# Patient Record
Sex: Female | Born: 1975 | Race: White | Hispanic: No | Marital: Single | Smoking: Never smoker
Health system: Southern US, Community
[De-identification: ages and names within clinical notes are randomized; demographics above are authoritative.]

## PROBLEM LIST (undated history)

## (undated) DIAGNOSIS — E785 Hyperlipidemia, unspecified: Secondary | ICD-10-CM

## (undated) DIAGNOSIS — F32A Depression, unspecified: Secondary | ICD-10-CM

## (undated) DIAGNOSIS — E119 Type 2 diabetes mellitus without complications: Secondary | ICD-10-CM

## (undated) DIAGNOSIS — J45909 Unspecified asthma, uncomplicated: Secondary | ICD-10-CM

## (undated) DIAGNOSIS — F329 Major depressive disorder, single episode, unspecified: Secondary | ICD-10-CM

## (undated) DIAGNOSIS — I10 Essential (primary) hypertension: Secondary | ICD-10-CM

## (undated) DIAGNOSIS — J302 Other seasonal allergic rhinitis: Secondary | ICD-10-CM

## (undated) HISTORY — DX: Hyperlipidemia, unspecified: E78.5

## (undated) HISTORY — DX: Essential (primary) hypertension: I10

---
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
## 1998-06-08 ENCOUNTER — Other Ambulatory Visit: Admission: RE | Admit: 1998-06-08 | Discharge: 1998-06-08 | Payer: Self-pay | Admitting: Gynecology

## 1999-06-29 ENCOUNTER — Other Ambulatory Visit: Admission: RE | Admit: 1999-06-29 | Discharge: 1999-06-29 | Payer: Self-pay | Admitting: Gynecology

## 2000-07-11 ENCOUNTER — Other Ambulatory Visit: Admission: RE | Admit: 2000-07-11 | Discharge: 2000-07-11 | Payer: Self-pay | Admitting: Gynecology

## 2001-07-30 ENCOUNTER — Other Ambulatory Visit: Admission: RE | Admit: 2001-07-30 | Discharge: 2001-07-30 | Payer: Self-pay | Admitting: Gynecology

## 2001-07-30 ENCOUNTER — Other Ambulatory Visit: Admission: RE | Admit: 2001-07-30 | Discharge: 2001-07-30 | Payer: Self-pay | Admitting: Obstetrics and Gynecology

## 2002-08-16 ENCOUNTER — Other Ambulatory Visit: Admission: RE | Admit: 2002-08-16 | Discharge: 2002-08-16 | Payer: Self-pay | Admitting: Obstetrics and Gynecology

## 2002-08-18 ENCOUNTER — Other Ambulatory Visit: Admission: RE | Admit: 2002-08-18 | Discharge: 2002-08-18 | Payer: Self-pay | Admitting: Obstetrics and Gynecology

## 2003-08-22 ENCOUNTER — Other Ambulatory Visit: Admission: RE | Admit: 2003-08-22 | Discharge: 2003-08-22 | Payer: Self-pay | Admitting: Obstetrics and Gynecology

## 2004-09-03 ENCOUNTER — Other Ambulatory Visit: Admission: RE | Admit: 2004-09-03 | Discharge: 2004-09-03 | Payer: Self-pay | Admitting: Obstetrics and Gynecology

## 2005-09-10 ENCOUNTER — Other Ambulatory Visit: Admission: RE | Admit: 2005-09-10 | Discharge: 2005-09-10 | Payer: Self-pay | Admitting: Obstetrics and Gynecology

## 2011-01-23 ENCOUNTER — Other Ambulatory Visit: Payer: Self-pay | Admitting: Family Medicine

## 2011-01-23 ENCOUNTER — Other Ambulatory Visit: Payer: Self-pay

## 2011-01-23 ENCOUNTER — Ambulatory Visit
Admission: RE | Admit: 2011-01-23 | Discharge: 2011-01-23 | Disposition: A | Discharge status: Home / Self Care | Payer: BC Managed Care – PPO | Source: Ambulatory Visit | Attending: Family Medicine | Admitting: Family Medicine

## 2011-01-23 DIAGNOSIS — R609 Edema, unspecified: Secondary | ICD-10-CM

## 2014-05-13 ENCOUNTER — Encounter (HOSPITAL_BASED_OUTPATIENT_CLINIC_OR_DEPARTMENT_OTHER): Payer: Self-pay | Admitting: *Deleted

## 2014-05-13 ENCOUNTER — Emergency Department (HOSPITAL_BASED_OUTPATIENT_CLINIC_OR_DEPARTMENT_OTHER)
Admission: EM | Admit: 2014-05-13 | Discharge: 2014-05-13 | Disposition: A | Discharge status: Home / Self Care | Payer: BC Managed Care – PPO | Attending: Emergency Medicine | Admitting: Emergency Medicine

## 2014-05-13 DIAGNOSIS — Y9301 Activity, walking, marching and hiking: Secondary | ICD-10-CM | POA: Diagnosis not present

## 2014-05-13 DIAGNOSIS — Y92219 Unspecified school as the place of occurrence of the external cause: Secondary | ICD-10-CM | POA: Insufficient documentation

## 2014-05-13 DIAGNOSIS — Z793 Long term (current) use of hormonal contraceptives: Secondary | ICD-10-CM | POA: Diagnosis not present

## 2014-05-13 DIAGNOSIS — Y998 Other external cause status: Secondary | ICD-10-CM | POA: Diagnosis not present

## 2014-05-13 DIAGNOSIS — F329 Major depressive disorder, single episode, unspecified: Secondary | ICD-10-CM | POA: Insufficient documentation

## 2014-05-13 DIAGNOSIS — S86111A Strain of other muscle(s) and tendon(s) of posterior muscle group at lower leg level, right leg, initial encounter: Secondary | ICD-10-CM

## 2014-05-13 DIAGNOSIS — S8992XA Unspecified injury of left lower leg, initial encounter: Secondary | ICD-10-CM | POA: Diagnosis present

## 2014-05-13 DIAGNOSIS — Z79899 Other long term (current) drug therapy: Secondary | ICD-10-CM | POA: Insufficient documentation

## 2014-05-13 DIAGNOSIS — S86812A Strain of other muscle(s) and tendon(s) at lower leg level, left leg, initial encounter: Secondary | ICD-10-CM | POA: Insufficient documentation

## 2014-05-13 DIAGNOSIS — X58XXXA Exposure to other specified factors, initial encounter: Secondary | ICD-10-CM | POA: Diagnosis not present

## 2014-05-13 HISTORY — DX: Major depressive disorder, single episode, unspecified: F32.9

## 2014-05-13 HISTORY — DX: Depression, unspecified: F32.A

## 2014-05-13 HISTORY — DX: Other seasonal allergic rhinitis: J30.2

## 2014-05-13 NOTE — ED Provider Notes (Signed)
CSN: [id number]     Arrival date & time 05/13/14  1945 History  This chart was scribed for Malvin Johns, MD by Tula Nakayama, ED Scribe. This patient was seen in room MHT13/MHT13 and the patient's care was started at 9:53 PM.    Chief Complaint  Patient presents with  . Leg Pain   The history is provided by the patient. No language interpreter was used.    HPI Comments: Abigail Vega is a 38 y.o. 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 She notes ankle swelling as an associated symptom. Pt states she heard a "pop" while she was walking, but denies falls or other injuries. Pt went to [facility] yesterday who referred her to an orthopedist. She saw the orthopedist, Dr. Berenice Primas, who diagnosed her with a medial gastric strain. She is wearing a walking boot on her affected leg today. Pt was told that she had to see [employer] for worker's comp purposes. She is here today for compliance with HR's policies. Pt denies numbness as associated symptoms.   Past Medical History  Diagnosis Date  . Depression   . Seasonal allergies    History reviewed. No pertinent past surgical history. No family history on file. History  Substance Use Topics  . Smoking status: Never Smoker   . Smokeless tobacco: Not on file  . Alcohol Use: No   OB History    No data available     Review of Systems  Musculoskeletal: Positive for arthralgias and gait problem.  Neurological: Negative for numbness.  All other systems reviewed and are negative.  Allergies  Review of patient's allergies indicates no known allergies.  Home Medications   Prior to Admission medications   Medication Sig Start Date End Date Taking? Authorizing Provider  Montelukast Sodium (SINGULAIR PO) Take by mouth.   Yes Historical Provider, MD  norgestrel-ethinyl estradiol (LO/OVRAL,CRYSELLE) 0.3-30 MG-MCG  tablet Take 1 tablet by mouth daily.   Yes Historical Provider, MD  Sertraline HCl (ZOLOFT PO) Take by mouth.   Yes Historical Provider, MD   BP 127/68 mmHg  Pulse 84  Temp(Src) 97.9 F (36.6 C) (Oral)  Resp 18  Ht _0  (1.727 m)  Wt 240 lb (108.863 kg)  BMI 36.50 kg/m2  SpO2 97% Physical Exam  Constitutional: She is oriented to person, place, and time. She appears well-developed and well-nourished.  HENT:  Head: Normocephalic and atraumatic.  Neck: Normal range of motion. Neck supple.  Cardiovascular: Normal rate.   Pulmonary/Chest: Effort normal.  Musculoskeletal: She exhibits edema and tenderness.  +tenderness along the medial gastrocnemius muscle.  No bony tenderness.  Mild swelling around ankle.  Normal sensation and motor function.  Pedal pulses intact.  No bony tenderness to knee or ankle.  Neurological: She is alert and oriented to person, place, and time.  Skin: Skin is warm and dry.  Psychiatric: She has a normal mood and affect.  Nursing note and vitals reviewed.   ED Course  Procedures (including critical care time) DIAGNOSTIC STUDIES: Oxygen Saturation is 97% on RA, normal by my interpretation.    COORDINATION OF CARE: 10:00 PM Discussed treatment plan with pt at bedside and pt agreed to plan. Advised pt to take Ibuprofen for pain symptoms.    Labs Review Labs Reviewed - No data to display  Imaging Review No results found.   EKG Interpretation None  MDM   Final diagnoses:  Rupture of medial head of right gastrocnemius, initial encounter    Pt has walking boot given by Dr. Berenice Primas.  Will continue ibuprofen.  Advised RICE.  I personally performed the services described in this documentation, which was scribed in my presence.  The recorded information has been reviewed and considered.    Malvin Johns, MD 05/13/14 2216

## 2014-05-13 NOTE — Discharge Instructions (Signed)
Muscle Tear A muscle tear is usually caused by over-exertion or stretching. The muscle often takes a while to heal. Muscle tears require 3 to 4 weeks to heal completely. A history of the injury and a physical exam may be performed. Sometimes, the injury is identified with radiographs and an MRI. Treatment for muscle injuries includes:  Resting and protecting the affected area until pain with motion is gone.  Putting ice on the injured area.  Put ice in a bag.  Place a towel between your skin and the bag.  Leave the ice on for 15 to 20 minutes, 3 to 4 times a day.  After two days, you can use heat to relieve spasms.  Using compression wraps to help control swelling and limit movement.  Raising (elevate) the injured area above the level of the heart (if possible) for the first 1 to 2 days after the injury.  Medicine may be prescribed to reduce pain and inflammation. Avoid strenuous activities that bring on muscle pain. Exercises to strengthen and stretch the injured muscle can help heal the muscle and prevent repeated injury. After the pain and swelling are gone, you may begin gradual strengthening exercises. Begin range-of-motion exercises and gentle stretching after 3 to 4 days of rest.  SEEK MEDICAL CARE IF:  Your injured muscle is not improving after 1 week of treatment. Document Released: 06/20/2004 Document Revised: 08/05/2011 Document Reviewed: 11/25/2008 [facility] Patient Information 2015 [place], [place]. This information is not intended to replace advice given to you by your health care provider. Make sure you discuss any questions you have with your health care provider.

## 2014-05-13 NOTE — ED Notes (Signed)
Pain in her left lower leg. States she felt a pop in her calf while walking 2 days ago.

## 2015-01-22 ENCOUNTER — Other Ambulatory Visit (HOSPITAL_COMMUNITY)
Admission: RE | Admit: 2015-01-22 | Discharge: 2015-01-22 | Disposition: A | Discharge status: Home / Self Care | Payer: BC Managed Care – PPO | Source: Ambulatory Visit | Attending: Physician Assistant | Admitting: Physician Assistant

## 2015-01-22 ENCOUNTER — Other Ambulatory Visit: Payer: Self-pay | Admitting: Physician Assistant

## 2015-01-22 DIAGNOSIS — Z113 Encounter for screening for infections with a predominantly sexual mode of transmission: Secondary | ICD-10-CM | POA: Insufficient documentation

## 2015-01-22 DIAGNOSIS — N760 Acute vaginitis: Secondary | ICD-10-CM | POA: Insufficient documentation

## 2015-01-22 DIAGNOSIS — Z124 Encounter for screening for malignant neoplasm of cervix: Secondary | ICD-10-CM | POA: Diagnosis present

## 2015-01-22 DIAGNOSIS — Z1151 Encounter for screening for human papillomavirus (HPV): Secondary | ICD-10-CM | POA: Diagnosis present

## 2015-01-25 LAB — CYTOLOGY - PAP

## 2015-03-06 ENCOUNTER — Other Ambulatory Visit: Payer: Self-pay

## 2015-03-06 DIAGNOSIS — Z1231 Encounter for screening mammogram for malignant neoplasm of breast: Secondary | ICD-10-CM

## 2015-03-23 ENCOUNTER — Ambulatory Visit
Admission: RE | Admit: 2015-03-23 | Discharge: 2015-03-23 | Disposition: A | Discharge status: Home / Self Care | Payer: BC Managed Care – PPO | Source: Ambulatory Visit

## 2015-03-23 DIAGNOSIS — Z1231 Encounter for screening mammogram for malignant neoplasm of breast: Secondary | ICD-10-CM

## 2017-07-18 ENCOUNTER — Other Ambulatory Visit: Payer: Self-pay | Admitting: Family Medicine

## 2017-07-18 ENCOUNTER — Other Ambulatory Visit (HOSPITAL_COMMUNITY)
Admission: RE | Admit: 2017-07-18 | Discharge: 2017-07-18 | Disposition: A | Discharge status: Home / Self Care | Payer: BC Managed Care – PPO | Source: Ambulatory Visit | Attending: Family Medicine | Admitting: Family Medicine

## 2017-07-18 DIAGNOSIS — Z124 Encounter for screening for malignant neoplasm of cervix: Secondary | ICD-10-CM | POA: Diagnosis present

## 2017-07-22 LAB — CYTOLOGY - PAP
DIAGNOSIS: NEGATIVE
HPV (WINDOPATH): NOT DETECTED

## 2017-09-16 ENCOUNTER — Other Ambulatory Visit: Payer: Self-pay | Admitting: Family Medicine

## 2017-09-16 DIAGNOSIS — Z1231 Encounter for screening mammogram for malignant neoplasm of breast: Secondary | ICD-10-CM

## 2017-09-18 ENCOUNTER — Ambulatory Visit
Admission: RE | Admit: 2017-09-18 | Discharge: 2017-09-18 | Disposition: A | Discharge status: Home / Self Care | Payer: BC Managed Care – PPO | Source: Ambulatory Visit | Attending: Family Medicine | Admitting: Family Medicine

## 2017-09-18 ENCOUNTER — Other Ambulatory Visit: Payer: Self-pay | Admitting: Family Medicine

## 2017-09-18 DIAGNOSIS — Z1231 Encounter for screening mammogram for malignant neoplasm of breast: Secondary | ICD-10-CM

## 2017-10-27 NOTE — Telephone Encounter (Signed)
 OK   Electronically signed by: Richardson Rumalda Metro, MD 10/27/17 [phone]

## 2017-10-30 NOTE — Progress Notes (Signed)
 Chief Complaint  Patient presents with  . optic disc edema    new pt here for unlaterial optic disc edema in lt eye she can't see out of eye.  pt is rt handed and she drinks about 16 oz of caffeine a week.      The patient is a 42 y.o. right handed White or Caucasian   Female  With papilledema, decreased vision left eye, sudden onset 10/24/17, left eye pain X 2 weeks, left disc edema by eye exam. Rare migraines, frequent milder HAs X long time. Has had sinusitis/ear pain in April. Some left neck pain.   The patient denies dizziness, weakness,  numbness or tingling, nausea, vomiting, seizures or loss of consciousness, trauma. Some caffeine.  Current Outpatient Prescriptions:  .  albuterol  90 mcg/actuation inhaler, Inhale into the lungs., Disp: , Rfl:  .  buPROPion  XL (WELLBUTRIN  XL) 150 MG 24 hr tablet, Take 150 mg by mouth daily., Disp: , Rfl:  .  cetirizine (ZYRTEC) 10 MG tablet, Take 10 mg by mouth., Disp: , Rfl:  .  metFORMIN (GLUCOPHAGE) 500 MG tablet, Take 500 mg by mouth 2 times daily., Disp: , Rfl:  .  montelukast  (SINGULAIR ) 10 mg tablet, , Disp: , Rfl:  .  norethindrone-ethinyl estradiol-iron (MICROGESTIN FE1.5/30) 1.5 mg-30 mcg (21)/75 mg (7) tablet, Take by mouth., Disp: , Rfl:  .  sertraline  (ZOLOFT ) 100 MG tablet, Take 50 mg by mouth. , Disp: , Rfl:  .  SUMAtriptan  (IMITREX ) 100 MG tablet, Take 100 mg by mouth. Maximum 200 mg/day., Disp: , Rfl:    Past Medical History:  Diagnosis Date  . Allergy   . Asthma   . Diabetes mellitus (HCC)   . Migraine      Past Surgical History:  Procedure Laterality Date  . EUA TONSILS    . NOSE SURGERY    . OTOPLASTY      Patient Active Problem List  Diagnosis  . Prediabetes  . Urge incontinence of urine  . Essential hematuria  . Conductive hearing loss, bilateral  . Dysfunction of both eustachian tubes  . Chronic pansinusitis  . Asthma  . Hematuria  . Papilledema  . Visual loss, left eye    Allergies:  Patient has no  known allergies.  Family History  Problem Relation Age of Onset  . Allergic rhinitis Father   . Diabetes Paternal Grandfather          A complete ROS was performed with pertinent positives and negatives noted in the HPI, and all others were negative except for above.   BP 130/74 (Site: Right arm, Position: Sitting, BP Cuff Size: Large)   Pulse 72   Ht 1.727 m (5' 8)   Wt (!) 155.1 kg (342 lb)   BMI 52.00 kg/m     The patient is normocephalic and atraumatic, well nourished.  There were no carotid bruits.  Mental status is normal: Awake, alert, oriented x 3, speech fluent, Memory, repetition, naming are intact. Normal fund of knowledge.  Cranial nerves 2 through 12 are abnormal. Pupils round, left Marcus-Gunn pupil, right normal, visual fields full, decreased vision left eye. The fundi are poorly visualized. Extra ocular movements are full with no nystagmus. Hearing is intact. The face is symmetric with no numbness. The tongue is midline and lower cranial nerves are normal. Shoulder shrug is strong.  Motor: All muscle groups were 5/5 in upper and lower extremities, with no pronator drift and good fine finger movements. Tone is normal and there  is no tremor.  Sensation is intact to touch, pinprick, vibration, position sense and double simultaneous stimulation.  Cerebellar is without dysmetria. Gait is without ataxia. The patient can walk on heels and toes and do tandam gait. Romberg is negative.  Reflexes are 2+ in UEs, 1 in knees and trace in ankles. Both toes are downgoing.  Straight leg raise is negative. Frontal release signs are absent.      IMPRESSION:  Papilledema by Ophthalmologic exam, disc edema, possible optic neuritis Headaches     PLAN:  MRI brain +/-, MS protocol, and MRI orbits +/- ASAP Solumedrol 500 mg IV  VEP ASAP  Follow up 3 weeks          Electronically signed by: Richardson Rumalda Metro, MD 10/30/17 [phone]

## 2017-11-20 NOTE — Progress Notes (Signed)
 Chief Complaint  Patient presents with  . Papilledema    MRI and VEP results     The patient is a 42 y.o. right handed White or Caucasian   Female  With sudden onset decreased vision left eye, left eye pain, left disc edema by eye exam. Rare migraines, frequent milder HAs X long time. Has had sinusitis/ear pain in April. Now getting vision back in left eye. Some left neck pain.   The patient denies dizziness, weakness,  numbness or tingling, nausea, vomiting, seizures or loss of consciousness, trauma.   Current Outpatient Prescriptions:  .  albuterol  90 mcg/actuation inhaler, Inhale into the lungs., Disp: , Rfl:  .  buPROPion  XL (WELLBUTRIN  XL) 150 MG 24 hr tablet, Take 150 mg by mouth daily., Disp: , Rfl:  .  cetirizine (ZYRTEC) 10 MG tablet, Take 10 mg by mouth., Disp: , Rfl:  .  metFORMIN (GLUCOPHAGE) 500 MG tablet, Take 500 mg by mouth 2 times daily., Disp: , Rfl:  .  montelukast  (SINGULAIR ) 10 mg tablet, , Disp: , Rfl:  .  norethindrone-ethinyl estradiol-iron (MICROGESTIN FE1.5/30) 1.5 mg-30 mcg (21)/75 mg (7) tablet, Take by mouth., Disp: , Rfl:  .  sertraline  (ZOLOFT ) 100 MG tablet, Take 50 mg by mouth. , Disp: , Rfl:  .  SUMAtriptan  (IMITREX ) 100 MG tablet, Take 100 mg by mouth. Maximum 200 mg/day., Disp: , Rfl:    Past Medical History:  Diagnosis Date  . Allergy   . Asthma   . Diabetes mellitus (HCC)   . Migraine      Past Surgical History:  Procedure Laterality Date  . EUA TONSILS    . NOSE SURGERY    . OTOPLASTY      Patient Active Problem List  Diagnosis  . Prediabetes  . Urge incontinence of urine  . Essential hematuria  . Conductive hearing loss, bilateral  . Dysfunction of both eustachian tubes  . Chronic pansinusitis  . Asthma  . Hematuria  . Visual loss, left eye  . Optic neuritis, left    Allergies:  Patient has no known allergies.  A complete ROS was performed with pertinent positives and negatives noted in the HPI, and all others were  negative except for above.  BP 132/90 (Site: Left arm, Position: Sitting)   Ht 1.727 m (5' 8)   Wt (!) 155.1 kg (342 lb)   BMI 52.00 kg/m   Mental status normal, the patient is awake, alert, oriented x 3, with fluent speech, normal fund of knowledge.  No carotid bruits.  Cranial nerves 2 through 12 are abnormal. Pupils round, left Marcus-Gunn pupil, right normal, visual fields full, decreased vision left eye. Extra ocular movements are full with no nystagmus. Hearing is intact. The face is symmetric with no numbness. The tongue is midline and lower cranial nerves are normal.  Motor: all muscle groups 5/5 in upper and lower extremities, with no drift, normal tone.  Sensation is intact to all modalities bilaterally.  Reflexes are equal bilaterally. 2+ UEs, 1/trace LEs  Cerebellar and gait are without ataxia.  Straight leg raise is negative bilaterally.   Results for orders placed or performed in visit on 08/08/17  UA, Microscopic If Indicated By Dipstick  Result Value Ref Range   Urine Color Yellow Yellow, Straw   Urine Clarity Clear Clear   Urine pH 6.0 5.0 - 8.0   Urine Specific Gravity 1.025 1.002 - 1.030   Urine Glucose Negative Negative MG/DL   Urine Bilirubin Negative Negative  Urine Ketone Negative Negative MG/DL   Urine Blood/Hb 1+ (A) Negative   Urine Protein Negative Negative MG/DL   Urine Urobilinogen 0.2 0.2 - 1.0 EU/DL   Urine Nitrite Negative Negative   Urine Leukocyte Esterase Trace (A) Negative   Urine WBC 3-8 (A) None seen, 0-2 /HPF   Urine RBC 3-8 (A) None seen, 0-2 /HPF   Urine Bacteria 1-5 0-1+ /HPF   Urine Epithelial Cells 1-5 0 - 5 /HPF   Recent Results (from the past 720 hour(s))  MR BRAIN WWO CONTRAST   Narrative   MRI BRAIN WITH AND WITHOUT CONTRAST, 11/03/2017 2:53 PM     INDICATION: optic nerve edema/MS protocol \ H47.10 Papilledema \ H54.62 Visual loss, left eye    COMPARISON: None.     TECHNIQUE: Multiplanar, multi-sequence MR  imaging of the entire brain was performed before and after intravenous administration of gadolinium-based contrast.     FINDINGS:   Calvarium/skull base: No focal marrow replacing lesion suggestive of neoplasm. Orbits: No focal mass. Paranasal sinuses: No air-fluid levels or substantial mucosal disease. Brain: No evidence of acute abnormality. No significant white matter disease. No evidence of acute infarct. No mass effect, acute hemorrhage, or hydrocephalus. No abnormal enhancement to suggest neoplasm, abscess, or mass lesion. Grossly normal flow-related signal in the major intracranial arteries and dural sinuses.     Additional comments: None.       Impression        Normal pre- and postcontrast MRI of the brain.        MR ORBITS/OPTIC NERVES W&WO CONTRAST   Narrative   MRI ORBITS WITH AND WITHOUT CONTRAST, 11/03/2017 2:25 PM     INDICATION: optic neuritis \ H47.10 Papilledema \ H54.62 Visual loss, left eye    COMPARISON: None.     TECHNIQUE: Multiplanar, multi-sequence MR imaging of the extracranial head and neck from the skull base to the thoracic inlet was performed before and after intravenous administration of gadolinium-based contrast. Although portions of the intracranial contents are included on the scan, the protocol is primarily designed for evaluating the orbits.     FINDINGS:   Periorbital soft tissues: Normal. Globes: Symmetrical, normal contour and signal. Optic nerves: Normal size, contour, and signal bilaterally. Extraocular muscles: Symmetrical, normal size and signal. Contrast: There is enhancement of the left optic nerve.           Impression     Abnormal MR appearance of the orbits.    Mass lesions. Enhancement of the left optic nerve, consistent with optic neuritis.    (should be) No mass lesions.   VEP 11/20/17: Prolonged L P100, 136 to 139 c/w optic neuritis.    IMPRESSION:  Left optic neuritis    PLAN:  Discussed  results   Follow up 6 months        Electronically signed by: Richardson Rumalda Metro, MD 11/20/17 [phone]

## 2020-09-14 ENCOUNTER — Other Ambulatory Visit: Payer: Self-pay | Admitting: Gastroenterology

## 2020-11-24 ENCOUNTER — Encounter (HOSPITAL_COMMUNITY): Payer: Self-pay | Admitting: Gastroenterology

## 2020-11-24 ENCOUNTER — Other Ambulatory Visit: Payer: Self-pay

## 2020-11-24 NOTE — Progress Notes (Signed)
Attempted to obtain medical history via telephone, unable to reach at this time. I left a voicemail to return pre surgical testing department's phone call.  

## 2020-11-28 ENCOUNTER — Other Ambulatory Visit (HOSPITAL_COMMUNITY)
Admission: RE | Admit: 2020-11-28 | Discharge: 2020-11-28 | Disposition: A | Discharge status: Home / Self Care | Payer: BC Managed Care – PPO | Source: Ambulatory Visit | Attending: Gastroenterology | Admitting: Gastroenterology

## 2020-11-28 DIAGNOSIS — Z01812 Encounter for preprocedural laboratory examination: Secondary | ICD-10-CM | POA: Insufficient documentation

## 2020-11-28 DIAGNOSIS — Z20822 Contact with and (suspected) exposure to covid-19: Secondary | ICD-10-CM | POA: Diagnosis not present

## 2020-11-29 ENCOUNTER — Other Ambulatory Visit: Payer: Self-pay | Admitting: Family Medicine

## 2020-11-29 DIAGNOSIS — Z1231 Encounter for screening mammogram for malignant neoplasm of breast: Secondary | ICD-10-CM

## 2020-11-29 LAB — SARS CORONAVIRUS 2 (TAT 6-24 HRS): SARS Coronavirus 2: NEGATIVE

## 2020-11-30 NOTE — Anesthesia Preprocedure Evaluation (Addendum)
Anesthesia Evaluation  Patient identified by MRN, date of birth, ID band Patient awake    Reviewed: Allergy & Precautions, NPO status , Patient's Chart, lab work & pertinent test results  History of Anesthesia Complications Negative for: history of anesthetic complications  Airway Mallampati: II  TM Distance: >3 FB Neck ROM: Full    Dental no notable dental hx.    Pulmonary neg pulmonary ROS,    Pulmonary exam normal        Cardiovascular negative cardio ROS Normal cardiovascular exam     Neuro/Psych Depression negative neurological ROS     GI/Hepatic negative GI ROS, Neg liver ROS,   Endo/Other  diabetes, Type 2, Oral Hypoglycemic AgentsMorbid obesity (BMI 58)  Renal/GU negative Renal ROS  negative genitourinary   Musculoskeletal negative musculoskeletal ROS (+)   Abdominal   Peds  Hematology negative hematology ROS (+)   Anesthesia Other Findings Day of surgery medications reviewed with patient.  Reproductive/Obstetrics negative OB ROS                            Anesthesia Physical Anesthesia Plan  ASA: 3  Anesthesia Plan: MAC   Post-op Pain Management:    Induction:   PONV Risk Score and Plan: Treatment may vary due to age or medical condition and Propofol infusion  Airway Management Planned: Natural Airway and Nasal Cannula  Additional Equipment:   Intra-op Plan:   Post-operative Plan:   Informed Consent: I have reviewed the patients History and Physical, chart, labs and discussed the procedure including the risks, benefits and alternatives for the proposed anesthesia with the patient or authorized representative who has indicated his/her understanding and acceptance.       Plan Discussed with: CRNA  Anesthesia Plan Comments:        Anesthesia Quick Evaluation

## 2020-12-01 ENCOUNTER — Ambulatory Visit (HOSPITAL_COMMUNITY): Payer: BC Managed Care – PPO

## 2020-12-01 ENCOUNTER — Observation Stay (HOSPITAL_COMMUNITY): Payer: BC Managed Care – PPO

## 2020-12-01 ENCOUNTER — Encounter (HOSPITAL_COMMUNITY): Payer: Self-pay | Admitting: Gastroenterology

## 2020-12-01 ENCOUNTER — Other Ambulatory Visit: Payer: Self-pay

## 2020-12-01 ENCOUNTER — Encounter (HOSPITAL_COMMUNITY)
Admission: AD | Disposition: A | Discharge status: Home / Self Care | Payer: Self-pay | Source: Home / Self Care | Attending: Internal Medicine

## 2020-12-01 ENCOUNTER — Ambulatory Visit (HOSPITAL_COMMUNITY): Payer: BC Managed Care – PPO | Admitting: Anesthesiology

## 2020-12-01 ENCOUNTER — Inpatient Hospital Stay (HOSPITAL_COMMUNITY)
Admission: AD | Admit: 2020-12-01 | Discharge: 2020-12-03 | DRG: 189 | Disposition: A | Discharge status: Home / Self Care | Payer: BC Managed Care – PPO | Attending: Internal Medicine | Admitting: Internal Medicine

## 2020-12-01 DIAGNOSIS — Z793 Long term (current) use of hormonal contraceptives: Secondary | ICD-10-CM

## 2020-12-01 DIAGNOSIS — Z79899 Other long term (current) drug therapy: Secondary | ICD-10-CM

## 2020-12-01 DIAGNOSIS — Z6841 Body Mass Index (BMI) 40.0 and over, adult: Secondary | ICD-10-CM

## 2020-12-01 DIAGNOSIS — F32A Depression, unspecified: Secondary | ICD-10-CM

## 2020-12-01 DIAGNOSIS — I10 Essential (primary) hypertension: Secondary | ICD-10-CM | POA: Diagnosis present

## 2020-12-01 DIAGNOSIS — R0902 Hypoxemia: Secondary | ICD-10-CM | POA: Diagnosis not present

## 2020-12-01 DIAGNOSIS — J811 Chronic pulmonary edema: Secondary | ICD-10-CM | POA: Diagnosis present

## 2020-12-01 DIAGNOSIS — G4730 Sleep apnea, unspecified: Secondary | ICD-10-CM | POA: Diagnosis present

## 2020-12-01 DIAGNOSIS — Z7984 Long term (current) use of oral hypoglycemic drugs: Secondary | ICD-10-CM

## 2020-12-01 DIAGNOSIS — J9611 Chronic respiratory failure with hypoxia: Secondary | ICD-10-CM | POA: Diagnosis present

## 2020-12-01 DIAGNOSIS — Z20822 Contact with and (suspected) exposure to covid-19: Secondary | ICD-10-CM | POA: Diagnosis present

## 2020-12-01 DIAGNOSIS — E66813 Obesity, class 3: Secondary | ICD-10-CM

## 2020-12-01 DIAGNOSIS — Z1211 Encounter for screening for malignant neoplasm of colon: Secondary | ICD-10-CM

## 2020-12-01 DIAGNOSIS — E119 Type 2 diabetes mellitus without complications: Secondary | ICD-10-CM

## 2020-12-01 DIAGNOSIS — J9601 Acute respiratory failure with hypoxia: Principal | ICD-10-CM | POA: Diagnosis present

## 2020-12-01 DIAGNOSIS — J45909 Unspecified asthma, uncomplicated: Secondary | ICD-10-CM

## 2020-12-01 DIAGNOSIS — J9811 Atelectasis: Secondary | ICD-10-CM | POA: Diagnosis present

## 2020-12-01 HISTORY — PX: COLONOSCOPY WITH PROPOFOL: SHX5780

## 2020-12-01 HISTORY — DX: Type 2 diabetes mellitus without complications: E11.9

## 2020-12-01 HISTORY — DX: Unspecified asthma, uncomplicated: J45.909

## 2020-12-01 LAB — RENAL FUNCTION PANEL
Albumin: 3.3 g/dL — ABNORMAL LOW (ref 3.5–5.0)
Anion gap: 9 (ref 5–15)
BUN: 11 mg/dL (ref 6–20)
CO2: 27 mmol/L (ref 22–32)
Calcium: 8.6 mg/dL — ABNORMAL LOW (ref 8.9–10.3)
Chloride: 101 mmol/L (ref 98–111)
Creatinine, Ser: 0.88 mg/dL (ref 0.44–1.00)
GFR, Estimated: >60 mL/min (ref >60)
Glucose, Bld: 84 mg/dL (ref 70–99)
Phosphorus: 3.7 mg/dL (ref 2.5–4.6)
Potassium: 4 mmol/L (ref 3.5–5.1)
Sodium: 137 mmol/L (ref 135–145)

## 2020-12-01 LAB — BLOOD GAS, ARTERIAL
Acid-Base Excess: 3.3 mmol/L — ABNORMAL HIGH (ref 0.0–2.0)
Bicarbonate: 29.1 mmol/L — ABNORMAL HIGH (ref 20.0–28.0)
Drawn by: 270211
FIO2: 21
O2 Saturation: 74.7 %
Patient temperature: 98.6
pCO2 arterial: 50.6 mmHg — ABNORMAL HIGH (ref 32.0–48.0)
pH, Arterial: 7.377 (ref 7.350–7.450)
pO2, Arterial: 45.6 mmHg — ABNORMAL LOW (ref 83.0–108.0)

## 2020-12-01 LAB — CBC WITH DIFFERENTIAL/PLATELET
Abs Immature Granulocytes: 0.04 10*3/uL (ref 0.00–0.07)
Basophils Absolute: 0.1 10*3/uL (ref 0.0–0.1)
Basophils Relative: 1 %
Eosinophils Absolute: 0.3 10*3/uL (ref 0.0–0.5)
Eosinophils Relative: 3 %
HCT: 50.2 % — ABNORMAL HIGH (ref 36.0–46.0)
Hemoglobin: 15.2 g/dL — ABNORMAL HIGH (ref 12.0–15.0)
Immature Granulocytes: 0 %
Lymphocytes Relative: 21 %
Lymphs Abs: 1.9 10*3/uL (ref 0.7–4.0)
MCH: 27.9 pg (ref 26.0–34.0)
MCHC: 30.3 g/dL (ref 30.0–36.0)
MCV: 92.1 fL (ref 80.0–100.0)
Monocytes Absolute: 0.6 10*3/uL (ref 0.1–1.0)
Monocytes Relative: 6 %
Neutro Abs: 6.3 10*3/uL (ref 1.7–7.7)
Neutrophils Relative %: 69 %
Platelets: 292 10*3/uL (ref 150–400)
RBC: 5.45 MIL/uL — ABNORMAL HIGH (ref 3.87–5.11)
RDW: 17.8 % — ABNORMAL HIGH (ref 11.5–15.5)
WBC: 9.1 10*3/uL (ref 4.0–10.5)
nRBC: 0 % (ref 0.0–0.2)

## 2020-12-01 LAB — PROCALCITONIN: Procalcitonin: <0.1 ng/mL

## 2020-12-01 LAB — D-DIMER, QUANTITATIVE: D-Dimer, Quant: <0.27 ug/mL-FEU (ref 0.00–0.50)

## 2020-12-01 LAB — GLUCOSE, CAPILLARY
Glucose-Capillary: 102 mg/dL — ABNORMAL HIGH (ref 70–99)
Glucose-Capillary: 106 mg/dL — ABNORMAL HIGH (ref 70–99)
Glucose-Capillary: 85 mg/dL (ref 70–99)

## 2020-12-01 LAB — BRAIN NATRIURETIC PEPTIDE: B Natriuretic Peptide: 36.2 pg/mL (ref 0.0–100.0)

## 2020-12-01 SURGERY — COLONOSCOPY WITH PROPOFOL
Anesthesia: Monitor Anesthesia Care

## 2020-12-01 MED ORDER — SODIUM CHLORIDE (PF) 0.9 % IJ SOLN
INTRAMUSCULAR | Status: AC
  Filled 2020-12-01: qty 50

## 2020-12-01 MED ORDER — INSULIN ASPART 100 UNIT/ML IJ SOLN: 0.0000 [IU] | Freq: Every day | INTRAMUSCULAR | Status: DC

## 2020-12-01 MED ORDER — PROMETHAZINE HCL 25 MG/ML IJ SOLN: 6.2500 mg | INTRAMUSCULAR | Status: DC | PRN

## 2020-12-01 MED ORDER — LORATADINE 10 MG PO TABS
10.0000 mg | ORAL_TABLET | Freq: Every day | ORAL | Status: DC
  Administered 2020-12-01 – 2020-12-03 (×3): 10 mg via ORAL
  Filled 2020-12-01 (×3): qty 1

## 2020-12-01 MED ORDER — PROPOFOL 10 MG/ML IV BOLUS
INTRAVENOUS | Status: AC
  Filled 2020-12-01: qty 20

## 2020-12-01 MED ORDER — ORAL CARE MOUTH RINSE
15.0000 mL | Freq: Two times a day (BID) | OROMUCOSAL | Status: DC
  Administered 2020-12-01 – 2020-12-03 (×4): 15 mL via OROMUCOSAL

## 2020-12-01 MED ORDER — SUMATRIPTAN SUCCINATE 50 MG PO TABS
100.0000 mg | ORAL_TABLET | ORAL | Status: DC | PRN
  Administered 2020-12-02 – 2020-12-03 (×3): 100 mg via ORAL
  Filled 2020-12-01 (×6): qty 2

## 2020-12-01 MED ORDER — ALBUTEROL SULFATE (2.5 MG/3ML) 0.083% IN NEBU: 2.5000 mg | INHALATION_SOLUTION | Freq: Four times a day (QID) | RESPIRATORY_TRACT | Status: DC

## 2020-12-01 MED ORDER — NORETHIN ACE-ETH ESTRAD-FE 1.5-30 MG-MCG PO TABS: 1.0000 | ORAL_TABLET | Freq: Every day | ORAL | Status: DC

## 2020-12-01 MED ORDER — ENOXAPARIN SODIUM 100 MG/ML IJ SOSY
90.0000 mg | PREFILLED_SYRINGE | INTRAMUSCULAR | Status: DC
  Administered 2020-12-01 – 2020-12-02 (×2): 90 mg via SUBCUTANEOUS
  Filled 2020-12-01 (×2): qty 1

## 2020-12-01 MED ORDER — IOHEXOL 350 MG/ML SOLN
100.0000 mL | Freq: Once | INTRAVENOUS | Status: AC | PRN
  Administered 2020-12-01: 100 mL via INTRAVENOUS

## 2020-12-01 MED ORDER — ENOXAPARIN SODIUM 30 MG/0.3ML IJ SOSY: 30.0000 mg | PREFILLED_SYRINGE | INTRAMUSCULAR | Status: DC

## 2020-12-01 MED ORDER — ALBUTEROL SULFATE HFA 108 (90 BASE) MCG/ACT IN AERS: 2.0000 | INHALATION_SPRAY | Freq: Four times a day (QID) | RESPIRATORY_TRACT | Status: DC | PRN

## 2020-12-01 MED ORDER — PROPOFOL 500 MG/50ML IV EMUL
INTRAVENOUS | Status: DC | PRN
  Administered 2020-12-01: 125 ug/kg/min via INTRAVENOUS

## 2020-12-01 MED ORDER — PROPOFOL 500 MG/50ML IV EMUL
INTRAVENOUS | Status: DC | PRN
  Administered 2020-12-01: 50 mg via INTRAVENOUS
  Administered 2020-12-01: 30 mg via INTRAVENOUS

## 2020-12-01 MED ORDER — SERTRALINE HCL 50 MG PO TABS
50.0000 mg | ORAL_TABLET | Freq: Every day | ORAL | Status: DC
  Administered 2020-12-01 – 2020-12-03 (×3): 50 mg via ORAL
  Filled 2020-12-01 (×3): qty 1

## 2020-12-01 MED ORDER — IPRATROPIUM-ALBUTEROL 0.5-2.5 (3) MG/3ML IN SOLN
3.0000 mL | Freq: Once | RESPIRATORY_TRACT | Status: AC
  Administered 2020-12-01: 3 mL via RESPIRATORY_TRACT

## 2020-12-01 MED ORDER — BUPROPION HCL ER (XL) 150 MG PO TB24
150.0000 mg | ORAL_TABLET | Freq: Every day | ORAL | Status: DC
  Administered 2020-12-01 – 2020-12-03 (×3): 150 mg via ORAL
  Filled 2020-12-01 (×3): qty 1

## 2020-12-01 MED ORDER — LACTATED RINGERS IV SOLN: INTRAVENOUS | Status: DC | PRN

## 2020-12-01 MED ORDER — INSULIN ASPART 100 UNIT/ML IJ SOLN: 0.0000 [IU] | Freq: Three times a day (TID) | INTRAMUSCULAR | Status: DC

## 2020-12-01 MED ORDER — ALBUTEROL SULFATE (2.5 MG/3ML) 0.083% IN NEBU: 2.5000 mg | INHALATION_SOLUTION | Freq: Four times a day (QID) | RESPIRATORY_TRACT | Status: DC | PRN

## 2020-12-01 MED ORDER — IPRATROPIUM-ALBUTEROL 0.5-2.5 (3) MG/3ML IN SOLN
RESPIRATORY_TRACT | Status: AC
  Filled 2020-12-01: qty 3

## 2020-12-01 MED ORDER — SODIUM CHLORIDE 0.9 % IV SOLN: INTRAVENOUS | Status: DC

## 2020-12-01 MED ORDER — ALBUTEROL SULFATE HFA 108 (90 BASE) MCG/ACT IN AERS
2.0000 | INHALATION_SPRAY | Freq: Four times a day (QID) | RESPIRATORY_TRACT | Status: DC
  Administered 2020-12-01 – 2020-12-02 (×3): 2 via RESPIRATORY_TRACT
  Filled 2020-12-01: qty 6.7

## 2020-12-01 MED ORDER — HYDRALAZINE HCL 20 MG/ML IJ SOLN: 10.0000 mg | Freq: Three times a day (TID) | INTRAMUSCULAR | Status: DC | PRN

## 2020-12-01 MED ORDER — ONDANSETRON HCL 4 MG/2ML IJ SOLN
INTRAMUSCULAR | Status: DC | PRN
  Administered 2020-12-01: 4 mg via INTRAVENOUS

## 2020-12-01 MED ORDER — KETAMINE HCL 10 MG/ML IJ SOLN
INTRAMUSCULAR | Status: AC
  Filled 2020-12-01: qty 1

## 2020-12-01 MED ORDER — MONTELUKAST SODIUM 10 MG PO TABS
10.0000 mg | ORAL_TABLET | Freq: Every day | ORAL | Status: DC
  Administered 2020-12-01 – 2020-12-02 (×2): 10 mg via ORAL
  Filled 2020-12-01 (×2): qty 1

## 2020-12-01 SURGICAL SUPPLY — 22 items

## 2020-12-01 NOTE — Progress Notes (Signed)
1045- No change in patient status. Patient still denies and trouble breathing or shortness of breath. Pt instructed on incentive spirometer and able to achieve 1536m. Pt maintaining sat in 90's on RA . Pt drops into 80's on RA. Dr. HDaiva Hugeand Dr. MWatt Climesto bedside to assess. CXR ordered, continue on 2L and monitor , incentive spirometer, and cough and deep breath. Pt sitting up on side of bed.

## 2020-12-01 NOTE — Discharge Instructions (Addendum)
From internal medicine hospitalist: I will continue steroids for 5 more days in case of inflammation contributing to your oxygen needs. Please follow up with your primary care to ask about getting a sleep study scheduled.  I will call you if the nasal swab for extended respiratory viruses is positive in case this is a viral infection as well.   ____________________________________________________________________________________________________ Pertinent to Gastroenterology:  YOU HAD AN ENDOSCOPIC PROCEDURE TODAY: Refer to the procedure report and other information in the discharge instructions given to you for any specific questions about what was found during the examination. If this information does not answer your questions, please call [facility] office at [phone] to clarify.   YOU SHOULD EXPECT: Some feelings of bloating in the abdomen. Passage of more gas than usual. Walking can help get rid of the air that was put into your GI tract during the procedure and reduce the bloating. If you had a lower endoscopy (such as a colonoscopy or flexible sigmoidoscopy) you may notice spotting of blood in your stool or on the toilet paper. Some abdominal soreness may be present for a day or two, also.  DIET: Your first meal following the procedure should be a light meal and then it is ok to progress to your normal diet. A half-sandwich or bowl of soup is an example of a good first meal. Heavy or fried foods are harder to digest and may make you feel nauseous or bloated. Drink plenty of fluids but you should avoid alcoholic beverages for 24 hours. If you had a esophageal dilation, please see attached instructions for diet.    ACTIVITY: Your care partner should take you home directly after the procedure. You should plan to take it easy, moving slowly for the rest of the day. You can resume normal activity the day after the procedure however YOU SHOULD NOT DRIVE, use power tools, machinery or perform tasks that  involve climbing or major physical exertion for 24 hours (because of the sedation medicines used during the test).   SYMPTOMS TO REPORT IMMEDIATELY: A gastroenterologist can be reached at any hour. Please call [phone]  for any of the following symptoms:  Following lower endoscopy (colonoscopy, flexible sigmoidoscopy) Excessive amounts of blood in the stool  Significant tenderness, worsening of abdominal pains  Swelling of the abdomen that is new, acute  Fever of 100 or higher  Following upper endoscopy (EGD, EUS, ERCP, esophageal dilation) Vomiting of blood or coffee ground material  New, significant abdominal pain  New, significant chest pain or pain under the shoulder blades  Painful or persistently difficult swallowing  New shortness of breath  Black, tarry-looking or red, bloody stools  FOLLOW UP:  If any biopsies were taken you will be contacted by phone or by letter within the next 1-3 weeks. Call [phone]  if you have not heard about the biopsies in 3 weeks.  Please also call with any specific questions about appointments or follow up tests. YOU HAD AN ENDOSCOPIC PROCEDURE TODAY: Refer to the procedure report and other information in the discharge instructions given to you for any specific questions about what was found during the examination. If this information does not answer your questions, please call [facility] office at [phone] to clarify.   YOU SHOULD EXPECT: Some feelings of bloating in the abdomen. Passage of more gas than usual. Walking can help get rid of the air that was put into your GI tract during the procedure and reduce the bloating. If you had a  lower endoscopy (such as a colonoscopy or flexible sigmoidoscopy) you may notice spotting of blood in your stool or on the toilet paper. Some abdominal soreness may be present for a day or two, also.  DIET: Your first meal following the procedure should be a light meal and then it is ok to progress to your normal  diet. A half-sandwich or bowl of soup is an example of a good first meal. Heavy or fried foods are harder to digest and may make you feel nauseous or bloated. Drink plenty of fluids but you should avoid alcoholic beverages for 24 hours. If you had a esophageal dilation, please see attached instructions for diet.    ACTIVITY: Your care partner should take you home directly after the procedure. You should plan to take it easy, moving slowly for the rest of the day. You can resume normal activity the day after the procedure however YOU SHOULD NOT DRIVE, use power tools, machinery or perform tasks that involve climbing or major physical exertion for 24 hours (because of the sedation medicines used during the test).   SYMPTOMS TO REPORT IMMEDIATELY: A gastroenterologist can be reached at any hour. Please call [phone]  for any of the following symptoms:  Following lower endoscopy (colonoscopy, flexible sigmoidoscopy) Excessive amounts of blood in the stool  Significant tenderness, worsening of abdominal pains  Swelling of the abdomen that is new, acute  Fever of 100 or higher  Following upper endoscopy (EGD, EUS, ERCP, esophageal dilation) Vomiting of blood or coffee ground material  New, significant abdominal pain  New, significant chest pain or pain under the shoulder blades  Painful or persistently difficult swallowing  New shortness of breath  Black, tarry-looking or red, bloody stools  FOLLOW UP:  If any biopsies were taken you will be contacted by phone or by letter within the next 1-3 weeks. Call [phone]  if you have not heard about the biopsies in 3 weeks.  Please also call with any specific questions about appointments or follow up tests.

## 2020-12-01 NOTE — Op Note (Addendum)
[facility] Patient Name: Abigail Vega Procedure Date: 12/01/2020 MRN: [id number] Attending MD: Clarene Essex , MD Date of Birth: [birth year] CSN: [id number] Age: 45 Admit Type: Outpatient Procedure:                Colonoscopy Indications:              Screening for colorectal malignant neoplasm, This                            is the patient's first colonoscopy Providers:                Clarene Essex, MD, Nelia Shi, RN, Cherylynn Ridges, Technician, Audelia Acton CRNA Referring MD:              Medicines:                Monitored Anesthesia Care Complications:            No immediate complications. Estimated Blood Loss:     Estimated blood loss: none. Estimated blood loss:                            none. Procedure:                Pre-Anesthesia Assessment:                           - Prior to the procedure, a History and Physical                            was performed, and patient medications and                            allergies were reviewed. The patient's tolerance of                            previous anesthesia was also reviewed. The risks                            and benefits of the procedure and the sedation                            options and risks were discussed with the patient.                            All questions were answered, and informed consent                            was obtained. Prior Anticoagulants: The patient has                            taken no previous anticoagulant or antiplatelet                            agents. ASA Grade  Assessment: III - A patient with                            severe systemic disease. After reviewing the risks                            and benefits, the patient was deemed in                            satisfactory condition to undergo the procedure.                           After obtaining informed consent, the colonoscope                            was passed under direct  vision. Throughout the                            procedure, the patient's blood pressure, pulse, and                            oxygen saturations were monitored continuously. The                            CF-HQ190L ([id number]) Olympus colonoscope was                            introduced through the anus and advanced to the the                            cecum, identified by appendiceal orifice and                            ileocecal valve. The ileocecal valve, appendiceal                            orifice, and rectum were photographed. The                            colonoscopy was performed without difficulty. The                            patient tolerated the procedure well. The quality                            of the bowel preparation was adequate. abdominal                            pressure was applied Scope In: 8:44:36 AM Scope Out: 9:00:38 AM Scope Withdrawal Time: 0 hours 10 minutes 32 seconds  Total Procedure Duration: 0 hours 16 minutes 2 seconds  Findings:      The colon (entire examined portion) appeared normal. Impression:               - The entire examined colon  is normal.                           - No specimens collected. Moderate Sedation:      Not Applicable - Patient had care per Anesthesia. Recommendation:           - Patient has a contact number available for                            emergencies. The signs and symptoms of potential                            delayed complications were discussed with the                            patient. Return to normal activities tomorrow.                            Written discharge instructions were provided to the                            patient.                           - Soft diet today.                           - Continue present medications.                           - Repeat colonoscopy in 10 years for screening                            purposes.                           - Return to GI office PRN.                            - Telephone GI clinic if symptomatic PRN. Procedure Code(s):        --- Professional ---                           [phone], Colonoscopy, flexible; diagnostic, including                            collection of specimen(s) by brushing or washing,                            when performed (separate procedure) Diagnosis Code(s):        --- Professional ---                           Z12.11, Encounter for screening for malignant                            neoplasm of colon CPT copyright  2019 [employer]. All rights reserved. The codes documented in this report are preliminary and upon coder review may  be revised to meet current compliance requirements. Clarene Essex, MD 12/01/2020 9:13:56 AM This report has been signed electronically. Number of Addenda: 0

## 2020-12-01 NOTE — Anesthesia Postprocedure Evaluation (Addendum)
Anesthesia Post Note  Patient: Abigail Vega  Procedure(s) Performed: COLONOSCOPY WITH PROPOFOL     Patient location during evaluation: PACU Anesthesia Type: MAC Level of consciousness: awake and alert and oriented Pain management: pain level controlled Vital Signs Assessment: post-procedure vital signs reviewed and stable Respiratory status: spontaneous breathing, nonlabored ventilation and patient connected to nasal cannula oxygen Cardiovascular status: blood pressure returned to baseline Postop Assessment: no apparent nausea or vomiting Anesthetic complications: no Comments: See intraop record for additional details. Patient with SpO2 mid to high 90s on RA prior to procedure. History of seasonal allergies for which she was prescribed albuterol inhaler which she has not used recently. Did take several days of prednisone about 2 weeks ago for what she described as allergy related non-productive cough. Uneventful MAC anesthetic with SpO2 90s throughout case with facemask O2. After procedure, SpO2 ranged from high 70s to mid-80s in PACU despite incentive spirometer, Duoneb, and deep breathing/cough instruction. Patient awake sitting on side of bed, completely asymptomatic. Denies dyspnea or any discomfort. Lungs CTAB. SpO2 low 90s on 2L [place] but when turned off, remains as low as high 70s. Chest xray with mild interstitial edema and upper normal cardiac silhouette. ABG notable for pO2 45 (on room air). Discussed case with proceduralist, hospitalist on call, and patient. I have advised admission for further workup of her hypoxemia and patient is in agreement. Will arrange admission under hospitalist service. Daiva Huge, MD   No notable events documented.  Last Vitals:  Vitals:   12/01/20 1100 12/01/20 1110  BP:    Pulse: 88 91  Resp:    Temp:    SpO2: 95% 98%    Last Pain:  Vitals:   12/01/20 1030  TempSrc:   PainSc: 0-No pain                 Brennan Bailey

## 2020-12-01 NOTE — H&P (Signed)
History and Physical    Abigail Vega HAL:[id number] DOB: [birth year] DOA: 12/01/2020  PCP: Lawerance Cruel, MD  Patient coming from: Home  Chief Complaint: hypoxia  HPI: Abigail Vega is a 45 y.o. female with medical history significant of asthma, morbid obesity. Presenting today for screen colonoscopy w/ GI. She successfully completed the procedure. While in recovery, her pulse-ox showed readings in the 73s. It was switched with another device and the readings were the same. The patient was asymptomatic. She was placed on 2L [place] and her sats rose to the 90s. The anesthesia MD became concerned and called for admission.   She reports a history of asthma for which she takes singulair, zyrtec, and PRN albuterol. She reports that she has not had to use her albuterol in some time. Her asthma is apparently allergy-driven. Her most recent need of asthma medication was a few weeks ago when she was cleaning out a room at school. It was dusty. It triggered a response that was quickly resolved. She denies a history of OSA, but has not had a formal evaluation. Thus, she does not use Cpap/BiPAP at night.   An ABG was requested and drawn. It showed a ph of 7.377, pCO2 of 50.6, and pO2 of 45.6. The patient denies dyspnea or chest pain. She is A&O x 3 during exam. With 2L [place] on, her sats are in the mid-90s. She was able to remove O2 support and talk to me on RA at full breath w/o breathlessness. She was not symptomatic during that time. However, her sats did range between 87% and 90%. With deep breaths, she was able to maintain 94%. She denies any lightheadedness, dizziness, or confusion.  Review of Systems:  Denies CP, dyspnea, palpitations, lightheadedness, dizzines, N/V/D, fever, chills, increased edema. Review of systems is otherwise negative for all not mentioned in HPI.   PMHx Past Medical History:  Diagnosis Date   Depression    Diabetes mellitus without complication ([place])    Seasonal allergies      PSHx Past Surgical History:  Procedure Laterality Date   NASAL SEPTOPLASTY W/ TURBINOPLASTY  1991   TONSILECTOMY, ADENOIDECTOMY, BILATERAL MYRINGOTOMY AND TUBES  1981   TYMPANOPLASTY  1986    SocHx  reports that she has never smoked. She has never used smokeless tobacco. She reports that she does not drink alcohol and does not use drugs.  No Known Allergies  FamHx History reviewed. No pertinent family history.  Prior to Admission medications   Medication Sig Start Date End Date Taking? Authorizing Provider  buPROPion (WELLBUTRIN XL) 150 MG 24 hr tablet Take 150 mg by mouth daily.   Yes [provider]  cetirizine (ZYRTEC) 10 MG tablet Take 10 mg by mouth daily.   Yes [provider]  ibuprofen (ADVIL) 200 MG tablet Take 600 mg by mouth every 8 (eight) hours as needed for moderate pain.   Yes [provider]  metFORMIN (GLUCOPHAGE) 500 MG tablet Take 1,000 mg by mouth daily with breakfast.   Yes [provider]  montelukast (SINGULAIR) 10 MG tablet Take 10 mg by mouth every morning.   Yes [provider]  norethindrone-ethinyl estradiol-iron (LOESTRIN FE) 1.5-30 MG-MCG tablet Take 1 tablet by mouth daily. 12/08/12  Yes [provider]  sertraline (ZOLOFT) 50 MG tablet Take 50 mg by mouth daily.   Yes [provider]  SUMAtriptan (IMITREX) 100 MG tablet Take 100 mg by mouth every 2 (two) hours as needed for migraine. May  repeat in 2 hours if headache persists or recurs.   Yes [provider]  UNABLE TO FIND Med Name: allergy injection every 3 weeks   Yes [provider]  albuterol (VENTOLIN HFA) 108 (90 Base) MCG/ACT inhaler Inhale 2 puffs into the lungs every 6 (six) hours as needed for wheezing or shortness of breath.    [provider]  EPINEPHrine 0.3 mg/0.3 mL IJ SOAJ injection Inject 0.3 mg into the muscle as needed for anaphylaxis.    [provider]    Physical Exam: Vitals:    12/01/20 1040 12/01/20 1055 12/01/20 1100 12/01/20 1110  BP:      Pulse: 93 95 88 91  Resp: 14     Temp:      TempSrc:      SpO2: 98% 94% 95% 98%  Weight:      Height:        General: 45 y.o. female resting in bed in NAD Eyes: PERRL, normal sclera ENMT: Nares patent w/o discharge, orophaynx clear, dentition normal, ears w/o discharge/lesions/ulcers Neck: Supple, trachea midline Cardiovascular: RRR, +S1, S2, no m/g/r, equal pulses throughout Respiratory: CTABL, no w/r/r, normal WOB on RA GI: BS+, NDNT, obese, no masses noted, no organomegaly noted MSK: No c/c; BLE edema chronic Skin: No rashes, bruises, ulcerations noted Neuro: A&O x 3, no focal deficits Psyc: Appropriate interaction and affect, calm/cooperative  Labs on Admission: I have personally reviewed following labs and imaging studies  CBC: No results for input(s): WBC, NEUTROABS, HGB, HCT, MCV, PLT in the last 168 hours. Basic Metabolic Panel: No results for input(s): NA, K, CL, CO2, GLUCOSE, BUN, CREATININE, CALCIUM, MG, PHOS in the last 168 hours. GFR: CrCl cannot be calculated (No successful lab value found.). Liver Function Tests: No results for input(s): AST, ALT, ALKPHOS, BILITOT, PROT, ALBUMIN in the last 168 hours. No results for input(s): LIPASE, AMYLASE in the last 168 hours. No results for input(s): AMMONIA in the last 168 hours. Coagulation Profile: No results for input(s): INR, PROTIME in the last 168 hours. Cardiac Enzymes: No results for input(s): CKTOTAL, CKMB, CKMBINDEX, TROPONINI in the last 168 hours. BNP (last 3 results) No results for input(s): PROBNP in the last 8760 hours. HbA1C: No results for input(s): HGBA1C in the last 72 hours. CBG: Recent Labs  Lab 12/01/20 0805  GLUCAP 102*   Lipid Profile: No results for input(s): CHOL, HDL, LDLCALC, TRIG, CHOLHDL, LDLDIRECT in the last 72 hours. Thyroid Function Tests: No results for input(s): TSH, T4TOTAL, FREET4, T3FREE, THYROIDAB in the  last 72 hours. Anemia Panel: No results for input(s): VITAMINB12, FOLATE, FERRITIN, TIBC, IRON, RETICCTPCT in the last 72 hours. Urine analysis: No results found for: COLORURINE, APPEARANCEUR, [place], [place], GLUCOSEU, [place], BILIRUBINUR, KETONESUR, PROTEINUR, UROBILINOGEN, NITRITE, LEUKOCYTESUR  Radiological Exams on Admission: DG Chest 2 View  Result Date: 12/01/2020 CLINICAL DATA:  Shortness of breath and hypoxia. EXAM: CHEST - 2 VIEW COMPARISON:  09/27/2013 FINDINGS: Cardiopericardial silhouette is at upper limits of normal for size. Subtle interstitial opacity suggests edema. No pneumothorax or pleural effusion. The visualized bony structures of the thorax show no acute abnormality. IMPRESSION: Subtle interstitial edema. No focal consolidation or pleural effusion. Electronically Signed   By: Misty Stanley M.D.   On: 12/01/2020 11:25    EKG: None obtained in Endoscopy  Assessment/Plan Acute respiratory failure w/ hypoxia     - place in obs, tele     - ABG as above     - question if there was  an element of OSA that came into play here; she will need formalized testing     - check d-dimer, BNP     - if renal fxn is appropriate, will send for CTA chest     - wean O2 as able     - add nebs, IS  HTN     - no previous history     - will have PRNs for now  Asthma     - PRN inhalers, home regimen     - add nebs  Morbid obesity     - counsel on diet/lifestyle changes     - follow up outpt  DM2     - SSI, glucose checks, A1c, DM diet  DVT prophylaxis: lovenox  Code Status: FULL  Family Communication: None at bedside  Consults called: None   Status is: Observation  The patient remains OBS appropriate and will d/c before 2 midnights.  Dispo: The patient is from: Home              Anticipated d/c is to: Home              Patient currently is not medically stable to d/c.   Difficult to place patient No  Time spent coordinating admission: 70 minutes  [place] [facility]  If 7PM-7AM, please contact night-coverage [email or web address]  12/01/2020, 1:03 PM

## 2020-12-01 NOTE — Transfer of Care (Signed)
Immediate Anesthesia Transfer of Care Note  Patient: Abigail Vega  Procedure(s) Performed: COLONOSCOPY WITH PROPOFOL  Patient Location: Endoscopy Unit  Anesthesia Type:MAC  Level of Consciousness: awake and patient cooperative  Airway & Oxygen Therapy: Patient Spontanous Breathing  Post-op Assessment: Report given to RN and Post -op Vital signs reviewed and stable  Post vital signs: Reviewed and stable  Last Vitals:  Vitals Value Taken Time  BP 95/55 12/01/20 0907  Temp 36.8 C 12/01/20 0907  Pulse 101 12/01/20 0910  Resp 22 12/01/20 0910  SpO2 89 % 12/01/20 0910  Vitals shown include unvalidated device data.  Last Pain:  Vitals:   12/01/20 0907  TempSrc: Oral  PainSc: Asleep         Complications: No notable events documented.

## 2020-12-01 NOTE — Progress Notes (Signed)
Abigail Vega 8:37 AM  Subjective: Patient discussed with our PA she has no GI complaint her family history is negative and she is due for chronic screening  Objective: Vital signs stable afebrile no acute distress exam please see preassessment evaluation  Assessment: Patient due for colon screening  Plan: We rediscussed colonoscopy and will proceed today with anesthesia assistance  [facility]  office [phone] After 5PM or if no answer call [phone]

## 2020-12-01 NOTE — Anesthesia Procedure Notes (Signed)
Procedure Name: MAC Date/Time: 12/01/2020 8:35 AM Performed by: Jari Pigg, CRNA Pre-anesthesia Checklist: Patient identified, Emergency Drugs available, Suction available and Patient being monitored Patient Re-evaluated:Patient Re-evaluated prior to induction Oxygen Delivery Method: Simple face mask

## 2020-12-01 NOTE — Progress Notes (Signed)
Patient doing fine post colonoscopy without any complaints unfortunately he cannot be weaned from her oxygen and despite not having any pulmonary complaints anesthesia is uncomfortable sending her home and a negative chest x-ray was done but she still was not having INF O2 sat on room air for them to feel comfortable so she is admitted overnight for observation and we thank the hospitalist team for assuming care and please call my partner Dr. Alessandra Bevels this weekend if any question or problem from our standpoint and patient is in good spirits and understands the above

## 2020-12-01 NOTE — Progress Notes (Signed)
Pt with O2 sats in the 79-85% on RA, pt stating no trouble breathing or has any complaints. No signs of distress. Pt kept on 2 liters O2 and maintaining sats in 90's. Dr. Daiva Huge by to talk and examine patient. Pt again dropping into 80's on RA. NO symptoms. Order for albuterol nebulizer and incentive spirometer.  Encouraging and demonstrating to patient to take deep breaths and cough.

## 2020-12-01 NOTE — Progress Notes (Signed)
Patient received from Endoscopy suite via wheelchair accompanied by staff. Initial assessment completed (see flowsheet)

## 2020-12-02 DIAGNOSIS — Z793 Long term (current) use of hormonal contraceptives: Secondary | ICD-10-CM | POA: Diagnosis not present

## 2020-12-02 DIAGNOSIS — E119 Type 2 diabetes mellitus without complications: Secondary | ICD-10-CM | POA: Diagnosis present

## 2020-12-02 DIAGNOSIS — J9601 Acute respiratory failure with hypoxia: Secondary | ICD-10-CM | POA: Diagnosis present

## 2020-12-02 DIAGNOSIS — G4730 Sleep apnea, unspecified: Secondary | ICD-10-CM | POA: Diagnosis present

## 2020-12-02 DIAGNOSIS — J45909 Unspecified asthma, uncomplicated: Secondary | ICD-10-CM | POA: Diagnosis present

## 2020-12-02 DIAGNOSIS — J9811 Atelectasis: Secondary | ICD-10-CM | POA: Diagnosis present

## 2020-12-02 DIAGNOSIS — Z1211 Encounter for screening for malignant neoplasm of colon: Secondary | ICD-10-CM | POA: Diagnosis not present

## 2020-12-02 DIAGNOSIS — Z7984 Long term (current) use of oral hypoglycemic drugs: Secondary | ICD-10-CM | POA: Diagnosis not present

## 2020-12-02 DIAGNOSIS — R0902 Hypoxemia: Secondary | ICD-10-CM | POA: Diagnosis not present

## 2020-12-02 DIAGNOSIS — F32A Depression, unspecified: Secondary | ICD-10-CM

## 2020-12-02 DIAGNOSIS — I10 Essential (primary) hypertension: Secondary | ICD-10-CM | POA: Diagnosis present

## 2020-12-02 DIAGNOSIS — J811 Chronic pulmonary edema: Secondary | ICD-10-CM | POA: Diagnosis present

## 2020-12-02 DIAGNOSIS — Z79899 Other long term (current) drug therapy: Secondary | ICD-10-CM | POA: Diagnosis not present

## 2020-12-02 DIAGNOSIS — Z6841 Body Mass Index (BMI) 40.0 and over, adult: Secondary | ICD-10-CM | POA: Diagnosis not present

## 2020-12-02 DIAGNOSIS — Z20822 Contact with and (suspected) exposure to covid-19: Secondary | ICD-10-CM | POA: Diagnosis present

## 2020-12-02 LAB — CBC
HCT: 49.2 % — ABNORMAL HIGH (ref 36.0–46.0)
Hemoglobin: 14.8 g/dL (ref 12.0–15.0)
MCH: 28 pg (ref 26.0–34.0)
MCHC: 30.1 g/dL (ref 30.0–36.0)
MCV: 93.2 fL (ref 80.0–100.0)
Platelets: 307 10*3/uL (ref 150–400)
RBC: 5.28 MIL/uL — ABNORMAL HIGH (ref 3.87–5.11)
RDW: 17.2 % — ABNORMAL HIGH (ref 11.5–15.5)
WBC: 9.6 10*3/uL (ref 4.0–10.5)
nRBC: 0 % (ref 0.0–0.2)

## 2020-12-02 LAB — COMPREHENSIVE METABOLIC PANEL
ALT: 15 U/L (ref 0–44)
AST: 15 U/L (ref 15–41)
Albumin: 3.3 g/dL — ABNORMAL LOW (ref 3.5–5.0)
Alkaline Phosphatase: 55 U/L (ref 38–126)
Anion gap: 9 (ref 5–15)
BUN: 13 mg/dL (ref 6–20)
CO2: 29 mmol/L (ref 22–32)
Calcium: 9.1 mg/dL (ref 8.9–10.3)
Chloride: 101 mmol/L (ref 98–111)
Creatinine, Ser: 0.85 mg/dL (ref 0.44–1.00)
GFR, Estimated: >60 mL/min (ref >60)
Glucose, Bld: 104 mg/dL — ABNORMAL HIGH (ref 70–99)
Potassium: 4.1 mmol/L (ref 3.5–5.1)
Sodium: 139 mmol/L (ref 135–145)
Total Bilirubin: 0.6 mg/dL (ref 0.3–1.2)
Total Protein: 7.9 g/dL (ref 6.5–8.1)

## 2020-12-02 LAB — HEMOGLOBIN A1C
Hgb A1c MFr Bld: 6.5 % — ABNORMAL HIGH (ref 4.8–5.6)
Mean Plasma Glucose: 139.85 mg/dL

## 2020-12-02 LAB — GLUCOSE, CAPILLARY
Glucose-Capillary: 121 mg/dL — ABNORMAL HIGH (ref 70–99)
Glucose-Capillary: 92 mg/dL (ref 70–99)
Glucose-Capillary: 109 mg/dL — ABNORMAL HIGH (ref 70–99)
Glucose-Capillary: 115 mg/dL — ABNORMAL HIGH (ref 70–99)

## 2020-12-02 LAB — SARS CORONAVIRUS 2 (TAT 6-24 HRS): SARS Coronavirus 2: NEGATIVE

## 2020-12-02 LAB — HIV ANTIBODY (ROUTINE TESTING W REFLEX): HIV Screen 4th Generation wRfx: NONREACTIVE

## 2020-12-02 MED ORDER — FUROSEMIDE 10 MG/ML IJ SOLN
40.0000 mg | Freq: Once | INTRAMUSCULAR | Status: AC
  Administered 2020-12-02: 40 mg via INTRAVENOUS
  Filled 2020-12-02: qty 4

## 2020-12-02 MED ORDER — IBUPROFEN 200 MG PO TABS
400.0000 mg | ORAL_TABLET | Freq: Once | ORAL | Status: AC
  Administered 2020-12-02: 400 mg via ORAL
  Filled 2020-12-02: qty 2

## 2020-12-02 NOTE — Progress Notes (Signed)
Progress Note    Martinique O Blomberg   XLK:[id number]  DOB: 1975/08/12  DOA: 12/01/2020     0  PCP: Lawerance Cruel, MD  CC: low oxygen  Hospital Course: Ms. Amini is a 45 yo female with PMH asthma, morbid obesity, DMII, depression who initially presented for an outpatient screening colonoscopy on 12/01/2020.  Colonoscopy was found to be normal with no complications during.  After procedure she was found to be hypoxic and requiring oxygen.  She was therefore further monitored in the hospital on oxygen with plans to try and wean further. She also underwent further work-up with CTA chest which was limited by body habitus but no obvious evidence of PE.  She was found to have "diffuse groundglass opacity throughout the lungs with interlobular septal thickening, consistent with pulmonary edema and/or infection." She was encouraged to use an incentive spirometer and also given a dose of Lasix.  Interval History:  Sitting in recliner when seen this morning asleep and snoring.  Awakens easily and states that breathing feels comfortable and nonlabored.  Discussed that plan is to try and ambulate further today and diurese him in efforts to wean off of oxygen.  ROS: Constitutional: negative for chills and malaise, Respiratory: negative for cough and dyspnea on exertion, Cardiovascular: negative for chest pain, and Gastrointestinal: negative for abdominal pain  Assessment & Plan: * Hypoxia - Multifactorial.  High suspicion that she had some worsened atelectasis while she was sedated for colonoscopy especially given body habitus.  CTA chest also reviewed noting some groundglass opacities which is most likely some pulmonary edema - Lasix 40 mg IV x1 given this morning.  If remains in hospital, will repeat tomorrow - Continue encouraging incentive spirometer aggressively - Patient needs to be assisted with walking in the hall and out of bed as much as possible -Needs referral for sleep study at discharge if  has never had one  Diabetes mellitus without complication (HCC) - D6U 6.5% - Continue diet control  Asthma - no s/s exacerbation; no wheezing on exam - continue albuterol   Depression - Continue Zoloft   Old records reviewed in assessment of this patient  Antimicrobials:   DVT prophylaxis: Lovenox   Code Status:   Code Status: Full Code Family Communication:   Disposition Plan: Status is: Inpatient  Remains inpatient appropriate because: remains hypoxic and O2 dependent  Dispo: The patient is from: Home              Anticipated d/c is to: Home              Patient currently is not medically stable to d/c.   Difficult to place patient No Risk of unplanned readmission score:     Objective: Blood pressure 118/68, pulse 93, temperature 97.6 F (36.4 C), temperature source Oral, resp. rate 17, height _0  (1.727 m), weight (!) 176.9 kg, last menstrual period 11/07/2020, SpO2 (!) 89 %.  Examination: General appearance: alert, cooperative, and no distress Head: Normocephalic, without obvious abnormality, atraumatic Eyes:  EOMI Lungs:  distant BS with some crackles appreciated bilaterally Heart: regular rate and rhythm and S1, S2 normal Abdomen: normal findings: bowel sounds normal and soft, non-tender Extremities:  obese, trace edema Skin: mobility and turgor normal Neurologic: Grossly normal  Consultants:  GI  Procedures:  CLN, 12/01/20  Data Reviewed: I have personally reviewed following labs and imaging studies Results for orders placed or performed during the hospital encounter of 12/01/20 (from the past 24 hour(s))  D-dimer,  quantitative     Status: None   Collection Time: 12/01/20  2:54 PM  Result Value Ref Range   D-Dimer, Quant <0.27 0.00 - 0.50 ug/mL-FEU  CBC with Differential/Platelet     Status: Abnormal   Collection Time: 12/01/20  2:54 PM  Result Value Ref Range   WBC 9.1 4.0 - 10.5 K/uL   RBC 5.45 (H) 3.87 - 5.11 MIL/uL   Hemoglobin 15.2 (H)  12.0 - 15.0 g/dL   HCT 50.2 (H) 36.0 - 46.0 %   MCV 92.1 80.0 - 100.0 fL   MCH 27.9 26.0 - 34.0 pg   MCHC 30.3 30.0 - 36.0 g/dL   RDW 17.8 (H) 11.5 - 15.5 %   Platelets 292 150 - 400 K/uL   nRBC 0.0 0.0 - 0.2 %   Neutrophils Relative % 69 %   Neutro Abs 6.3 1.7 - 7.7 K/uL   Lymphocytes Relative 21 %   Lymphs Abs 1.9 0.7 - 4.0 K/uL   Monocytes Relative 6 %   Monocytes Absolute 0.6 0.1 - 1.0 K/uL   Eosinophils Relative 3 %   Eosinophils Absolute 0.3 0.0 - 0.5 K/uL   Basophils Relative 1 %   Basophils Absolute 0.1 0.0 - 0.1 K/uL   Immature Granulocytes 0 %   Abs Immature Granulocytes 0.04 0.00 - 0.07 K/uL  Renal function panel     Status: Abnormal   Collection Time: 12/01/20  2:54 PM  Result Value Ref Range   Sodium 137 135 - 145 mmol/L   Potassium 4.0 3.5 - 5.1 mmol/L   Chloride 101 98 - 111 mmol/L   CO2 27 22 - 32 mmol/L   Glucose, Bld 84 70 - 99 mg/dL   BUN 11 6 - 20 mg/dL   Creatinine, Ser 0.88 0.44 - 1.00 mg/dL   Calcium 8.6 (L) 8.9 - 10.3 mg/dL   Phosphorus 3.7 2.5 - 4.6 mg/dL   Albumin 3.3 (L) 3.5 - 5.0 g/dL   GFR, Estimated >60 >60 mL/min   Anion gap 9 5 - 15  Brain natriuretic peptide     Status: None   Collection Time: 12/01/20  2:54 PM  Result Value Ref Range   B Natriuretic Peptide 36.2 0.0 - 100.0 pg/mL  Glucose, capillary     Status: Abnormal   Collection Time: 12/01/20  5:05 PM  Result Value Ref Range   Glucose-Capillary 106 (H) 70 - 99 mg/dL  SARS CORONAVIRUS 2 (TAT 6-24 HRS) Nasopharyngeal Nasopharyngeal Swab     Status: None   Collection Time: 12/01/20  6:13 PM   Specimen: Nasopharyngeal Swab  Result Value Ref Range   SARS Coronavirus 2 NEGATIVE NEGATIVE  Procalcitonin - Baseline     Status: None   Collection Time: 12/01/20  6:51 PM  Result Value Ref Range   Procalcitonin <0.10 ng/mL  Glucose, capillary     Status: None   Collection Time: 12/01/20  8:22 PM  Result Value Ref Range   Glucose-Capillary 85 70 - 99 mg/dL  Hemoglobin A1c     Status:  Abnormal   Collection Time: 12/02/20  3:12 AM  Result Value Ref Range   Hgb A1c MFr Bld 6.5 (H) 4.8 - 5.6 %   Mean Plasma Glucose 139.85 mg/dL  Comprehensive metabolic panel     Status: Abnormal   Collection Time: 12/02/20  3:12 AM  Result Value Ref Range   Sodium 139 135 - 145 mmol/L   Potassium 4.1 3.5 - 5.1 mmol/L   Chloride 101 98 -  111 mmol/L   CO2 29 22 - 32 mmol/L   Glucose, Bld 104 (H) 70 - 99 mg/dL   BUN 13 6 - 20 mg/dL   Creatinine, Ser 0.85 0.44 - 1.00 mg/dL   Calcium 9.1 8.9 - 10.3 mg/dL   Total Protein 7.9 6.5 - 8.1 g/dL   Albumin 3.3 (L) 3.5 - 5.0 g/dL   AST 15 15 - 41 U/L   ALT 15 0 - 44 U/L   Alkaline Phosphatase 55 38 - 126 U/L   Total Bilirubin 0.6 0.3 - 1.2 mg/dL   GFR, Estimated >60 >60 mL/min   Anion gap 9 5 - 15  CBC     Status: Abnormal   Collection Time: 12/02/20  3:12 AM  Result Value Ref Range   WBC 9.6 4.0 - 10.5 K/uL   RBC 5.28 (H) 3.87 - 5.11 MIL/uL   Hemoglobin 14.8 12.0 - 15.0 g/dL   HCT 49.2 (H) 36.0 - 46.0 %   MCV 93.2 80.0 - 100.0 fL   MCH 28.0 26.0 - 34.0 pg   MCHC 30.1 30.0 - 36.0 g/dL   RDW 17.2 (H) 11.5 - 15.5 %   Platelets 307 150 - 400 K/uL   nRBC 0.0 0.0 - 0.2 %  Glucose, capillary     Status: Abnormal   Collection Time: 12/02/20  7:48 AM  Result Value Ref Range   Glucose-Capillary 121 (H) 70 - 99 mg/dL  Glucose, capillary     Status: None   Collection Time: 12/02/20 12:30 PM  Result Value Ref Range   Glucose-Capillary 92 70 - 99 mg/dL    Recent Results (from the past 240 hour(s))  SARS CORONAVIRUS 2 (TAT 6-24 HRS) Nasopharyngeal Nasopharyngeal Swab     Status: None   Collection Time: 11/28/20  2:29 PM   Specimen: Nasopharyngeal Swab  Result Value Ref Range Status   SARS Coronavirus 2 NEGATIVE NEGATIVE Final    Comment: (NOTE) SARS-CoV-2 target nucleic acids are NOT DETECTED.  The SARS-CoV-2 RNA is generally detectable in upper and lower respiratory specimens during the acute phase of infection. Negative results do not  preclude SARS-CoV-2 infection, do not rule out co-infections with other pathogens, and should not be used as the sole basis for treatment or other patient management decisions. Negative results must be combined with clinical observations, patient history, and epidemiological information. The expected result is Negative.  Fact Sheet for Patients: SugarRoll.be  Fact Sheet for Healthcare Providers: https://www.woods-mathews.com/  This test is not yet approved or cleared by the [place] FDA and  has been authorized for detection and/or diagnosis of SARS-CoV-2 by FDA under an Emergency Use Authorization (EUA). This EUA will remain  in effect (meaning this test can be used) for the duration of the COVID-19 declaration under Se ction 564(b)(1) of the Act, 21 U.S.C. section 360bbb-3(b)(1), unless the authorization is terminated or revoked sooner.  Performed at [facility], [place] [address], [place], [place] [address]   SARS CORONAVIRUS 2 (TAT 6-24 HRS) Nasopharyngeal Nasopharyngeal Swab     Status: None   Collection Time: 12/01/20  6:13 PM   Specimen: Nasopharyngeal Swab  Result Value Ref Range Status   SARS Coronavirus 2 NEGATIVE NEGATIVE Final    Comment: (NOTE) SARS-CoV-2 target nucleic acids are NOT DETECTED.  The SARS-CoV-2 RNA is generally detectable in upper and lower respiratory specimens during the acute phase of infection. Negative results do not preclude SARS-CoV-2 infection, do not rule out co-infections with other pathogens, and  should not be used as the sole basis for treatment or other patient management decisions. Negative results must be combined with clinical observations, patient history, and epidemiological information. The expected result is Negative.  Fact Sheet for Patients: SugarRoll.be  Fact Sheet for Healthcare Providers: https://www.woods-mathews.com/  This test  is not yet approved or cleared by the [place] FDA and  has been authorized for detection and/or diagnosis of SARS-CoV-2 by FDA under an Emergency Use Authorization (EUA). This EUA will remain  in effect (meaning this test can be used) for the duration of the COVID-19 declaration under Se ction 564(b)(1) of the Act, 21 U.S.C. section 360bbb-3(b)(1), unless the authorization is terminated or revoked sooner.  Performed at [facility], [place] [address] [place], [place] [address]      Radiology Studies: DG Chest 2 View  Result Date: 12/01/2020 CLINICAL DATA:  Shortness of breath and hypoxia. EXAM: CHEST - 2 VIEW COMPARISON:  09/27/2013 FINDINGS: Cardiopericardial silhouette is at upper limits of normal for size. Subtle interstitial opacity suggests edema. No pneumothorax or pleural effusion. The visualized bony structures of the thorax show no acute abnormality. IMPRESSION: Subtle interstitial edema. No focal consolidation or pleural effusion. Electronically Signed   By: Misty Stanley M.D.   On: 12/01/2020 11:25   CT Angio Chest Pulmonary Embolism (PE) W or WO Contrast  Result Date: 12/01/2020 CLINICAL DATA:  PE suspected, hypoxia, colonoscopy this morning EXAM: CT ANGIOGRAPHY CHEST WITH CONTRAST TECHNIQUE: Multidetector CT imaging of the chest was performed using the standard protocol during bolus administration of intravenous contrast. Multiplanar CT image reconstructions and MIPs were obtained to evaluate the vascular anatomy. CONTRAST:  150m OMNIPAQUE IOHEXOL 350 MG/ML SOLN COMPARISON:  None. FINDINGS: Cardiovascular: Examination for pulmonary embolism is significantly limited by body habitus and breath motion artifact throughout. Within this limitation, no evidence of pulmonary embolism through the segmental pulmonary arterial level. Cardiomegaly. No pericardial effusion. Mediastinum/Nodes: Prominent mediastinal lymph nodes. Thyroid gland, trachea, and esophagus demonstrate no  significant findings. Lungs/Pleura: Diffuse ground-glass opacity throughout the lungs with interlobular septal thickening. No pleural effusion or pneumothorax. Upper Abdomen: No acute abnormality. Musculoskeletal: No chest wall abnormality. No acute or significant osseous findings. Review of the MIP images confirms the above findings. IMPRESSION: 1. Examination for pulmonary embolism is significantly limited by body habitus and breath motion artifact throughout. Within this limitation, no evidence of pulmonary embolism through the segmental pulmonary arterial level. 2. Diffuse ground-glass opacity throughout the lungs with interlobular septal thickening, consistent with pulmonary edema and/or infection. 3. Cardiomegaly. 4. Prominent mediastinal lymph nodes, likely reactive. Electronically Signed   By: AEddie CandleM.D.   On: 12/01/2020 17:48   CT Angio Chest Pulmonary Embolism (PE) W or WO Contrast  Final Result    DG Chest 2 View  Final Result      Scheduled Meds:  buPROPion  150 mg Oral Daily   enoxaparin (LOVENOX) injection  90 mg Subcutaneous Q24H   insulin aspart  0-15 Units Subcutaneous TID WC   insulin aspart  0-5 Units Subcutaneous QHS   loratadine  10 mg Oral Daily   mouth rinse  15 mL Mouth Rinse BID   montelukast  10 mg Oral Daily   norethindrone-ethinyl estradiol-iron  1 tablet Oral Daily   sertraline  50 mg Oral Daily   PRN Meds: albuterol, hydrALAZINE, promethazine, SUMAtriptan Continuous Infusions:   LOS: 0 days  Time spent: Greater than 50% of the 35 minute visit was spent in counseling/coordination of care for  the patient as laid out in the A&P.   Dwyane Dee, MD [facility] 12/02/2020, 2:14 PM

## 2020-12-02 NOTE — Assessment & Plan Note (Addendum)
-  Multifactorial.  High suspicion that she had some worsened atelectasis while she was sedated for colonoscopy especially given body habitus.  CTA chest also reviewed noting some groundglass opacities which is most likely some pulmonary edema but cannot rule out viral infection as well - Lasix 40 mg IV given daily x 2 days in hospital - check RVP prior to discharge - walk saturation test, she desatted to 79% on RA; she will be discharged with 2 L O2 continuously and referred to her PCP to follow up and order sleep study as well - hopeful this may still be combo of atelectasis, volume, and possibly viral; we did discuss any amount of weight loss will also help

## 2020-12-02 NOTE — Assessment & Plan Note (Addendum)
-  continue albuterol  - prednisone prescribed at discharge

## 2020-12-02 NOTE — Assessment & Plan Note (Signed)
-  Continue Zoloft 

## 2020-12-02 NOTE — Progress Notes (Signed)
[facility] Progress Note  Abigail Vega 45 y.o. [birth year]  CC: Hypoxia   Subjective: Patient seen and examined at bedside.  She is sitting comfortably in the chair eating her breakfast.  Denies any shortness of breath or chest pain.  Situation 95% on 2 L of oxygen.  ROS : Negative for chest pain.  Afebrile   Objective: Vital signs in last 24 hours: Vitals:   12/02/20 0147 12/02/20 0406  BP: 133/73 128/65  Pulse: 96 93  Resp:  18  Temp: 98 F (36.7 C) 97.8 F (36.6 C)  SpO2: 96% 93%    Physical Exam:  General:  Morbidly obese  Head:  Normocephalic, without obvious abnormality, atraumatic  Eyes:  , EOM's intact,   Lungs:   Fine basilar crackles noted more over the right side  Heart:  Regular rate and rhythm, S1, S2 normal  Abdomen:   Soft, non-tender, bowel sounds active all four quadrants,  no masses,   Extremities: Extremities normal, atraumatic, no  edema       Lab Results: Recent Labs    12/01/20 1454 12/02/20 0312  NA 137 139  K 4.0 4.1  CL 101 101  CO2 27 29  GLUCOSE 84 104*  BUN 11 13  CREATININE 0.88 0.85  CALCIUM 8.6* 9.1  PHOS 3.7  --    Recent Labs    12/01/20 1454 12/02/20 0312  AST  --  15  ALT  --  15  ALKPHOS  --  55  BILITOT  --  0.6  PROT  --  7.9  ALBUMIN 3.3* 3.3*   Recent Labs    12/01/20 1454 12/02/20 0312  WBC 9.1 9.6  NEUTROABS 6.3  --   HGB 15.2* 14.8  HCT 50.2* 49.2*  MCV 92.1 93.2  PLT 292 307   No results for input(s): LABPROT, INR in the last 72 hours.    Assessment/Plan: -Hypoxia following sedation for colonoscopy yesterday.  Improving.  Most likely combination of sleep apnea and pulmonary edema  Recommendations ------------------------- -Recommend observation without supplemental oxygen.  okay to discharge from GI standpoint, If she  is able to maintain her saturation,  - Call us back if needed   Otis Brace MD, [place] 12/02/2020, 9:17 AM  Contact #  [phone]

## 2020-12-02 NOTE — Assessment & Plan Note (Signed)
-  A1c 6.5% - Continue diet control

## 2020-12-02 NOTE — Hospital Course (Signed)
Abigail Vega is a 45 yo female with PMH asthma, morbid obesity, DMII, depression who initially presented for an outpatient screening colonoscopy on 12/01/2020.  Colonoscopy was found to be normal with no complications during.  After procedure she was found to be hypoxic and requiring oxygen.  She was therefore further monitored in the hospital on oxygen with plans to try and wean further. She also underwent further work-up with CTA chest which was limited by body habitus but no obvious evidence of PE.  She was found to have "diffuse groundglass opacity throughout the lungs with interlobular septal thickening, consistent with pulmonary edema and/or infection." She was encouraged to use an incentive spirometer and also given a dose of Lasix.

## 2020-12-03 ENCOUNTER — Encounter (HOSPITAL_COMMUNITY): Payer: Self-pay | Admitting: Gastroenterology

## 2020-12-03 DIAGNOSIS — Z6841 Body Mass Index (BMI) 40.0 and over, adult: Secondary | ICD-10-CM

## 2020-12-03 DIAGNOSIS — R0902 Hypoxemia: Secondary | ICD-10-CM | POA: Diagnosis not present

## 2020-12-03 DIAGNOSIS — E66813 Obesity, class 3: Secondary | ICD-10-CM

## 2020-12-03 LAB — RESPIRATORY PANEL BY PCR

## 2020-12-03 LAB — GLUCOSE, CAPILLARY
Glucose-Capillary: 111 mg/dL — ABNORMAL HIGH (ref 70–99)
Glucose-Capillary: 101 mg/dL — ABNORMAL HIGH (ref 70–99)

## 2020-12-03 MED ORDER — METHYLPREDNISOLONE SODIUM SUCC 125 MG IJ SOLR
125.0000 mg | Freq: Once | INTRAMUSCULAR | Status: AC
  Administered 2020-12-03: 125 mg via INTRAVENOUS
  Filled 2020-12-03: qty 2

## 2020-12-03 MED ORDER — PREDNISONE 20 MG PO TABS: 40.0000 mg | ORAL_TABLET | Freq: Every day | ORAL | 0 refills | Status: DC

## 2020-12-03 MED ORDER — FUROSEMIDE 10 MG/ML IJ SOLN
40.0000 mg | Freq: Once | INTRAMUSCULAR | Status: AC
  Administered 2020-12-03: 40 mg via INTRAVENOUS
  Filled 2020-12-03: qty 4

## 2020-12-03 MED ORDER — PREDNISONE 20 MG PO TABS: 40.0000 mg | ORAL_TABLET | Freq: Every day | ORAL | 0 refills | Status: AC

## 2020-12-03 NOTE — Assessment & Plan Note (Signed)
-  weight loss discussed - outpatient sleep study recommended to rule out OHS/OSA

## 2020-12-03 NOTE — Progress Notes (Signed)
SATURATION QUALIFICATIONS: (This note is used to comply with regulatory documentation for home oxygen)  Patient Saturations on Room Air at Rest = 83%  Patient Saturations on Room Air while Ambulating =79%  Patient Saturations on 2 Liters of oxygen while Ambulating = 90%  Please briefly explain why patient needs home oxygen:Pt with Oxygen desaturations ambulating on room air

## 2020-12-03 NOTE — Discharge Summary (Signed)
Physician Discharge Summary   Martinique O Blau OAC:[id number] DOB: Jan 05, 1976 DOA: 12/01/2020  PCP: Lawerance Cruel, MD  Admit date: 12/01/2020 Discharge date: 12/03/2020  Admitted From: home Disposition:  home Discharging physician: Dwyane Dee, MD  Recommendations for Outpatient Follow-up:  Refer for sleep study May need pulm referral Encourage weight loss; may benefit from referral to nutritionist   Home Health:  Equipment/Devices: Home O2, 2L  Patient discharged to home in Discharge Condition: stable Risk of unplanned readmission score: Unplanned Admission- Pilot do not use: 5.98  CODE STATUS: Full Diet recommendation:  Diet Orders (From admission, onward)     Start     Ordered   12/03/20 0000  Diet Carb Modified        12/03/20 1157   12/01/20 1604  Diet Carb Modified Fluid consistency: Thin; Room service appropriate? Yes  Diet effective now       Question Answer Comment  Diet-HS Snack? Nothing   Calorie Level Medium 1600-2000   Fluid consistency: Thin   Room service appropriate? Yes      12/01/20 1603            Hospital Course: Ms. Kouns is a 45 yo female with PMH asthma, morbid obesity, DMII, depression who initially presented for an outpatient screening colonoscopy on 12/01/2020.  Colonoscopy was found to be normal with no complications during.  After procedure she was found to be hypoxic and requiring oxygen.  She was therefore further monitored in the hospital on oxygen with plans to try and wean further. She also underwent further work-up with CTA chest which was limited by body habitus but no obvious evidence of PE.  She was found to have "diffuse groundglass opacity throughout the lungs with interlobular septal thickening, consistent with pulmonary edema and/or infection." She was encouraged to use an incentive spirometer and also given a dose of Lasix.  * Hypoxia - Multifactorial.  High suspicion that she had some worsened atelectasis while she was sedated  for colonoscopy especially given body habitus.  CTA chest also reviewed noting some groundglass opacities which is most likely some pulmonary edema but cannot rule out viral infection as well - Lasix 40 mg IV given daily x 2 days in hospital - check RVP prior to discharge - walk saturation test, she desatted to 79% on RA; she will be discharged with 2 L O2 continuously and referred to her PCP to follow up and order sleep study as well - hopeful this may still be combo of atelectasis, volume, and possibly viral; we did discuss any amount of weight loss will also help   Morbid obesity with BMI of 50.0-59.9, adult ([place]) - weight loss discussed - outpatient sleep study recommended to rule out OHS/OSA  Diabetes mellitus without complication (HCC) - W1U 6.5% - Continue diet control  Asthma - continue albuterol  - prednisone prescribed at discharge   Depression - Continue Zoloft    The patient's chronic medical conditions were treated accordingly per the patient's home medication regimen except as noted.  On day of discharge, patient was felt deemed stable for discharge. Patient/family member advised to call PCP or come back to ER if needed.   Principal Diagnosis: Hypoxia  Discharge Diagnoses: Active Hospital Problems   Diagnosis Date Noted   Hypoxia 12/01/2020    Priority: High   Morbid obesity with BMI of 50.0-59.9, adult ([place]) 12/03/2020   Depression    Asthma    Diabetes mellitus without complication [facility])     [facility]  Problems  No resolved problems to display.    Discharge Instructions     Diet Carb Modified   Complete by: As directed    Increase activity slowly   Complete by: As directed       Allergies as of 12/03/2020   No Known Allergies      Medication List     TAKE these medications    albuterol 108 (90 Base) MCG/ACT inhaler Commonly known as: VENTOLIN HFA Inhale 2 puffs into the lungs every 6 (six) hours as needed for wheezing or shortness of  breath.   buPROPion 150 MG 24 hr tablet Commonly known as: WELLBUTRIN XL Take 150 mg by mouth daily. Notes to patient: Last Dose of this Medication was given on December 03, 2020 at 9:52 am    cetirizine 10 MG tablet Commonly known as: ZYRTEC Take 10 mg by mouth daily.   EPINEPHrine 0.3 mg/0.3 mL Soaj injection Commonly known as: EPI-PEN Inject 0.3 mg into the muscle as needed for anaphylaxis.   ibuprofen 200 MG tablet Commonly known as: ADVIL Take 600 mg by mouth every 8 (eight) hours as needed for moderate pain.   metFORMIN 500 MG tablet Commonly known as: GLUCOPHAGE Take 1,000 mg by mouth daily with breakfast.   montelukast 10 MG tablet Commonly known as: SINGULAIR Take 10 mg by mouth every morning.   norethindrone-ethinyl estradiol-iron 1.5-30 MG-MCG tablet Commonly known as: LOESTRIN FE Take 1 tablet by mouth daily.   predniSONE 20 MG tablet Commonly known as: DELTASONE Take 2 tablets (40 mg total) by mouth daily with breakfast for 5 days.   sertraline 50 MG tablet Commonly known as: ZOLOFT Take 50 mg by mouth daily. Notes to patient: Last Dose of this Medication was given on December 03, 2020 at 9:52 am   SUMAtriptan 100 MG tablet Commonly known as: IMITREX Take 100 mg by mouth every 2 (two) hours as needed for migraine. May repeat in 2 hours if headache persists or recurs. Notes to patient: This Medication was given on December 03, 2020 at 9:52 am   [place] Name: allergy injection every 3 weeks               Durable Medical Equipment  (From admission, onward)           Start     Ordered   12/03/20 1157  DME Oxygen  Once       Question Answer Comment  Length of Need 6 Months   Mode or (Route) Nasal cannula   Liters per Minute 2   Frequency Continuous (stationary and portable oxygen unit needed)   Oxygen delivery system Gas      12/03/20 1157            Follow-up Information     Clarene Essex, MD. Call.   Specialty:  Gastroenterology Why: As needed or if GI question or problem otherwise repeat colon screening in 10 years Contact information: [address] [place] [phone]                No Known Allergies  Consultations:   Discharge Exam: BP 99/75 (BP Location: Left Arm)   Pulse 83   Temp 97.8 F (36.6 C) (Oral)   Resp 20   Ht _0  (1.727 m)   Wt (!) 176.9 kg   LMP 11/07/2020 Comment: "no chance of pregnancy" per pt  SpO2 97%   BMI 59.30 kg/m  General appearance: alert,  cooperative, and no distress Head: Normocephalic, without obvious abnormality, atraumatic Eyes:  EOMI Lungs:  increased coarse sound with mild wheezing appreciated Heart: regular rate and rhythm and S1, S2 normal Abdomen: normal findings: bowel sounds normal and soft, non-tender Extremities:  obese, trace edema Skin: mobility and turgor normal Neurologic: Grossly normal  The results of significant diagnostics from this hospitalization (including imaging, microbiology, ancillary and laboratory) are listed below for reference.   Microbiology: Recent Results (from the past 240 hour(s))  SARS CORONAVIRUS 2 (TAT 6-24 HRS) Nasopharyngeal Nasopharyngeal Swab     Status: None   Collection Time: 11/28/20  2:29 PM   Specimen: Nasopharyngeal Swab  Result Value Ref Range Status   SARS Coronavirus 2 NEGATIVE NEGATIVE Final    Comment: (NOTE) SARS-CoV-2 target nucleic acids are NOT DETECTED.  The SARS-CoV-2 RNA is generally detectable in upper and lower respiratory specimens during the acute phase of infection. Negative results do not preclude SARS-CoV-2 infection, do not rule out co-infections with other pathogens, and should not be used as the sole basis for treatment or other patient management decisions. Negative results must be combined with clinical observations, patient history, and epidemiological information. The expected result is Negative.  Fact Sheet for  Patients: SugarRoll.be  Fact Sheet for Healthcare Providers: https://www.woods-mathews.com/  This test is not yet approved or cleared by the [place] FDA and  has been authorized for detection and/or diagnosis of SARS-CoV-2 by FDA under an Emergency Use Authorization (EUA). This EUA will remain  in effect (meaning this test can be used) for the duration of the COVID-19 declaration under Se ction 564(b)(1) of the Act, 21 U.S.C. section 360bbb-3(b)(1), unless the authorization is terminated or revoked sooner.  Performed at [facility], [place] [address], [place], [place] [address]   SARS CORONAVIRUS 2 (TAT 6-24 HRS) Nasopharyngeal Nasopharyngeal Swab     Status: None   Collection Time: 12/01/20  6:13 PM   Specimen: Nasopharyngeal Swab  Result Value Ref Range Status   SARS Coronavirus 2 NEGATIVE NEGATIVE Final    Comment: (NOTE) SARS-CoV-2 target nucleic acids are NOT DETECTED.  The SARS-CoV-2 RNA is generally detectable in upper and lower respiratory specimens during the acute phase of infection. Negative results do not preclude SARS-CoV-2 infection, do not rule out co-infections with other pathogens, and should not be used as the sole basis for treatment or other patient management decisions. Negative results must be combined with clinical observations, patient history, and epidemiological information. The expected result is Negative.  Fact Sheet for Patients: SugarRoll.be  Fact Sheet for Healthcare Providers: https://www.woods-mathews.com/  This test is not yet approved or cleared by the [place] FDA and  has been authorized for detection and/or diagnosis of SARS-CoV-2 by FDA under an Emergency Use Authorization (EUA). This EUA will remain  in effect (meaning this test can be used) for the duration of the COVID-19 declaration under Se ction 564(b)(1) of the Act, 21 U.S.C. section  360bbb-3(b)(1), unless the authorization is terminated or revoked sooner.  Performed at [facility], [place] [address], [place], [place] [address]      Labs: BNP (last 3 results) Recent Labs    12/01/20 1454  BNP 27.7   Basic Metabolic Panel: Recent Labs  Lab 12/01/20 1454 12/02/20 0312  NA 137 139  K 4.0 4.1  CL 101 101  CO2 27 29  GLUCOSE 84 104*  BUN 11 13  CREATININE 0.88 0.85  CALCIUM 8.6* 9.1  PHOS 3.7  --  Liver Function Tests: Recent Labs  Lab 12/01/20 1454 12/02/20 0312  AST  --  15  ALT  --  15  ALKPHOS  --  55  BILITOT  --  0.6  PROT  --  7.9  ALBUMIN 3.3* 3.3*   No results for input(s): LIPASE, AMYLASE in the last 168 hours. No results for input(s): AMMONIA in the last 168 hours. CBC: Recent Labs  Lab 12/01/20 1454 12/02/20 0312  WBC 9.1 9.6  NEUTROABS 6.3  --   HGB 15.2* 14.8  HCT 50.2* 49.2*  MCV 92.1 93.2  PLT 292 307   Cardiac Enzymes: No results for input(s): CKTOTAL, CKMB, CKMBINDEX, TROPONINI in the last 168 hours. BNP: Invalid input(s): POCBNP CBG: Recent Labs  Lab 12/02/20 1230 12/02/20 1735 12/02/20 2155 12/03/20 0745 12/03/20 1214  GLUCAP 92 109* 115* 111* 101*   D-Dimer Recent Labs    12/01/20 1454  DDIMER <0.27   Hgb A1c Recent Labs    12/02/20 0312  HGBA1C 6.5*   Lipid Profile No results for input(s): CHOL, HDL, LDLCALC, TRIG, CHOLHDL, LDLDIRECT in the last 72 hours. Thyroid function studies No results for input(s): TSH, T4TOTAL, T3FREE, THYROIDAB in the last 72 hours.  Invalid input(s): FREET3 Anemia work up No results for input(s): VITAMINB12, FOLATE, FERRITIN, TIBC, IRON, RETICCTPCT in the last 72 hours. Urinalysis No results found for: COLORURINE, APPEARANCEUR, [place], [place], GLUCOSEU, [place], [place], [place], PROTEINUR, UROBILINOGEN, NITRITE, LEUKOCYTESUR Sepsis Labs Invalid input(s): PROCALCITONIN,  WBC,  LACTICIDVEN Microbiology Recent Results (from the past 240 hour(s))   SARS CORONAVIRUS 2 (TAT 6-24 HRS) Nasopharyngeal Nasopharyngeal Swab     Status: None   Collection Time: 11/28/20  2:29 PM   Specimen: Nasopharyngeal Swab  Result Value Ref Range Status   SARS Coronavirus 2 NEGATIVE NEGATIVE Final    Comment: (NOTE) SARS-CoV-2 target nucleic acids are NOT DETECTED.  The SARS-CoV-2 RNA is generally detectable in upper and lower respiratory specimens during the acute phase of infection. Negative results do not preclude SARS-CoV-2 infection, do not rule out co-infections with other pathogens, and should not be used as the sole basis for treatment or other patient management decisions. Negative results must be combined with clinical observations, patient history, and epidemiological information. The expected result is Negative.  Fact Sheet for Patients: SugarRoll.be  Fact Sheet for Healthcare Providers: https://www.woods-mathews.com/  This test is not yet approved or cleared by the [place] FDA and  has been authorized for detection and/or diagnosis of SARS-CoV-2 by FDA under an Emergency Use Authorization (EUA). This EUA will remain  in effect (meaning this test can be used) for the duration of the COVID-19 declaration under Se ction 564(b)(1) of the Act, 21 U.S.C. section 360bbb-3(b)(1), unless the authorization is terminated or revoked sooner.  Performed at [facility], [place] [address], [place], [place] [address]   SARS CORONAVIRUS 2 (TAT 6-24 HRS) Nasopharyngeal Nasopharyngeal Swab     Status: None   Collection Time: 12/01/20  6:13 PM   Specimen: Nasopharyngeal Swab  Result Value Ref Range Status   SARS Coronavirus 2 NEGATIVE NEGATIVE Final    Comment: (NOTE) SARS-CoV-2 target nucleic acids are NOT DETECTED.  The SARS-CoV-2 RNA is generally detectable in upper and lower respiratory specimens during the acute phase of infection. Negative results do not preclude SARS-CoV-2 infection, do  not rule out co-infections with other pathogens, and should not be used as the sole basis for treatment or other patient management decisions. Negative results must be combined with  clinical observations, patient history, and epidemiological information. The expected result is Negative.  Fact Sheet for Patients: SugarRoll.be  Fact Sheet for Healthcare Providers: https://www.woods-mathews.com/  This test is not yet approved or cleared by the [place] FDA and  has been authorized for detection and/or diagnosis of SARS-CoV-2 by FDA under an Emergency Use Authorization (EUA). This EUA will remain  in effect (meaning this test can be used) for the duration of the COVID-19 declaration under Se ction 564(b)(1) of the Act, 21 U.S.C. section 360bbb-3(b)(1), unless the authorization is terminated or revoked sooner.  Performed at [facility], [place] [address] [place], [place] [address]     Procedures/Studies: DG Chest 2 View  Result Date: 12/01/2020 CLINICAL DATA:  Shortness of breath and hypoxia. EXAM: CHEST - 2 VIEW COMPARISON:  09/27/2013 FINDINGS: Cardiopericardial silhouette is at upper limits of normal for size. Subtle interstitial opacity suggests edema. No pneumothorax or pleural effusion. The visualized bony structures of the thorax show no acute abnormality. IMPRESSION: Subtle interstitial edema. No focal consolidation or pleural effusion. Electronically Signed   By: Misty Stanley M.D.   On: 12/01/2020 11:25   CT Angio Chest Pulmonary Embolism (PE) W or WO Contrast  Result Date: 12/01/2020 CLINICAL DATA:  PE suspected, hypoxia, colonoscopy this morning EXAM: CT ANGIOGRAPHY CHEST WITH CONTRAST TECHNIQUE: Multidetector CT imaging of the chest was performed using the standard protocol during bolus administration of intravenous contrast. Multiplanar CT image reconstructions and MIPs were obtained to evaluate the vascular anatomy. CONTRAST:   146m OMNIPAQUE IOHEXOL 350 MG/ML SOLN COMPARISON:  None. FINDINGS: Cardiovascular: Examination for pulmonary embolism is significantly limited by body habitus and breath motion artifact throughout. Within this limitation, no evidence of pulmonary embolism through the segmental pulmonary arterial level. Cardiomegaly. No pericardial effusion. Mediastinum/Nodes: Prominent mediastinal lymph nodes. Thyroid gland, trachea, and esophagus demonstrate no significant findings. Lungs/Pleura: Diffuse ground-glass opacity throughout the lungs with interlobular septal thickening. No pleural effusion or pneumothorax. Upper Abdomen: No acute abnormality. Musculoskeletal: No chest wall abnormality. No acute or significant osseous findings. Review of the MIP images confirms the above findings. IMPRESSION: 1. Examination for pulmonary embolism is significantly limited by body habitus and breath motion artifact throughout. Within this limitation, no evidence of pulmonary embolism through the segmental pulmonary arterial level. 2. Diffuse ground-glass opacity throughout the lungs with interlobular septal thickening, consistent with pulmonary edema and/or infection. 3. Cardiomegaly. 4. Prominent mediastinal lymph nodes, likely reactive. Electronically Signed   By: AEddie CandleM.D.   On: 12/01/2020 17:48     Time coordinating discharge: Over 30 minutes    DDwyane Dee MD  [facility] 12/03/2020, 2:01 PM

## 2020-12-03 NOTE — TOC Transition Note (Signed)
Transition of Care [facility]) - CM/SW Discharge Note   Patient Details  Name: Abigail Vega MRN: [id number] Date of Birth: 1975-07-25  Transition of Care [facility]) CM/SW Contact:  Lennart Pall, LCSW Phone Number: 12/03/2020, 12:15 PM   Clinical Narrative:    Alerted by MD that pt medically cleared for dc today and will need Home O2.  Pt is aware and agreeable and has no agency preference for supplier.  Referral placed with [employer] for delivery of portable tank to hospital room and other supplies/ concentrator to the home.  No further TOC needs.   Final next level of care: Home/Self Care Barriers to Discharge: No Barriers Identified   Patient Goals and CMS Choice Patient states their goals for this hospitalization and ongoing recovery are:: return home      Discharge Placement                       Discharge Plan and Services                DME Arranged: Oxygen DME Agency: Other - Comment [facility]) Date DME Agency Contacted: 12/03/20 Time DME Agency Contacted: 1215 Representative spoke with at DME Agency: Brenton Grills [phone])            Social Determinants of Health ([place]) Interventions     Readmission Risk Interventions No flowsheet data found.

## 2020-12-03 NOTE — Progress Notes (Signed)
Pt to be discharged to home this afternoon. Pt given discharge instructions including all discharge Medications and schedules for these Medications. Pt verbalized understanding of all discharge instructions Discharge AVS with Pt at time of discharge

## 2020-12-13 ENCOUNTER — Telehealth: Payer: Self-pay | Admitting: Pulmonary Disease

## 2020-12-13 NOTE — Telephone Encounter (Signed)
Called patient but she did not answer. Left message for her to call us back.   Called [place] and spoke with Melissa. She stated that she would reach out to the patient but she wanted to go ahead and book the appt for the patient tomorrow at 930 with Dr. Annamaria Boots.   Will leave this encounter open in case she calls back.

## 2020-12-13 NOTE — Telephone Encounter (Signed)
Yes- happy to see her tomorrow

## 2020-12-13 NOTE — Telephone Encounter (Signed)
Patient called back. She accepts the appt for tomorrow at 930am. I provided her with the address to the office. She verbalized understanding and appreciated [place] working to get her seen sooner.   Nothing further needed at time of call.

## 2020-12-13 NOTE — Progress Notes (Signed)
12/14/20- 13 yoF never smoker for evaluation of hypoxia during colonoscopy, courtesy of  Dr Lona Kettle.   Medical problem list includes Hypoxia, Asthma, Allergic Rhinitis, Morbid Obesity, DM2, Depression, Teaches second grade. Hosp 7/8-7/10/22 for hypoxia after normal colonoscopy. Desaturated on walk test to 79% and sent home on O2 2-3L/ [employer].   CT showed diffuse ground glass ( obesity, edema or viral).Given lasix x 2 days. Discharged on  O2, albuterol, prednisone taper. Advised to get sleep study. Allergy (Dr Margit Hanks on allergy vaccine.  Pending Cardiology consultation. Pending Sleep Apnea consultation with Dr Maxwell Caul.    Labs in hosp- BNP 36, D-dimer < 0.27, Covid neg, WBC 9.1, Hgb 15.2 Epworth score- Body weight today-295 lbs Covid vax- 3 [employer] Walk test 12/14/20- - RA- 89%/ HR 96/ min. Desat after 1/4 lap to 86%, required 2L O2 to hold at 90% on 2 L.      Came in today on O2 2L 95%. Gives hx recurrent pneumonias. She isn't sure if she has had pneumonia vaccine.  Mild Asthma- no hospitalizations.  No family or personal hx of heart disease or blood clots. Notices cough/ wheeze mostly with respiratory infections. No recent change in routine DOE with brisk walk/ stairs. Not aware of conceern about oxygenation with prior anesthesia for ENT procedures years ago.  CTa chest 12/01/20-  IMPRESSION: 1. Examination for pulmonary embolism is significantly limited by body habitus and breath motion artifact throughout. Within this limitation, no evidence of pulmonary embolism through the segmental pulmonary arterial level. 2. Diffuse ground-glass opacity throughout the lungs with interlobular septal thickening, consistent with pulmonary edema and/or infection. 3. Cardiomegaly. 4. Prominent mediastinal lymph nodes, likely reactive.  Prior to Admission medications   Medication Sig Start Date End Date Taking? Authorizing Provider  albuterol (VENTOLIN HFA) 108 (90 Base) MCG/ACT inhaler  Inhale 2 puffs into the lungs every 6 (six) hours as needed for wheezing or shortness of breath.   Yes [provider]  buPROPion (WELLBUTRIN XL) 150 MG 24 hr tablet Take 150 mg by mouth daily.   Yes [provider]  cetirizine (ZYRTEC) 10 MG tablet Take 10 mg by mouth daily.   Yes [provider]  EPINEPHrine 0.3 mg/0.3 mL IJ SOAJ injection Inject 0.3 mg into the muscle as needed for anaphylaxis.   Yes [provider]  ibuprofen (ADVIL) 200 MG tablet Take 600 mg by mouth every 8 (eight) hours as needed for moderate pain.   Yes [provider]  metFORMIN (GLUCOPHAGE) 500 MG tablet Take 1,000 mg by mouth daily with breakfast.   Yes [provider]  montelukast (SINGULAIR) 10 MG tablet Take 10 mg by mouth every morning.   Yes [provider]  norethindrone-ethinyl estradiol-iron (LOESTRIN FE) 1.5-30 MG-MCG tablet Take 1 tablet by mouth daily. 12/08/12  Yes [provider]  sertraline (ZOLOFT) 50 MG tablet Take 50 mg by mouth daily.   Yes [provider]  SUMAtriptan (IMITREX) 100 MG tablet Take 100 mg by mouth every 2 (two) hours as needed for migraine. May repeat in 2 hours if headache persists or recurs.   Yes [provider]  UNABLE TO FIND Med Name: allergy injection every 3 weeks   Yes [provider]   Past Medical History:  Diagnosis Date   Asthma    Depression    Diabetes mellitus without complication ([place])    Seasonal allergies    Past Surgical History:  Procedure Laterality Date   COLONOSCOPY WITH PROPOFOL N/A 12/01/2020  Procedure: COLONOSCOPY WITH PROPOFOL;  Surgeon: Clarene Essex, MD;  Location: [facility];  Service: Endoscopy;  Laterality: N/A;   NASAL SEPTOPLASTY W/ TURBINOPLASTY  1991   TONSILECTOMY, ADENOIDECTOMY, BILATERAL MYRINGOTOMY [place]   History reviewed. No pertinent family history. Social History   Socioeconomic History   Marital  status: Single    Spouse name: Not on file   Number of children: Not on file   Years of education: Not on file   Highest education level: Not on file  Occupational History   Not on file  Tobacco Use   Smoking status: Never   Smokeless tobacco: Never  Substance and Sexual Activity   Alcohol use: No   Drug use: No   Sexual activity: Not on file  Other Topics Concern   Not on file  Social History Narrative   Not on file   Social Determinants of Health   Financial Resource Strain: Not on file  Food Insecurity: Not on file  Transportation Needs: Not on file  Physical Activity: Not on file  Stress: Not on file  Social Connections: Not on file  Intimate Partner Violence: Not on file   ROS-see HPI   + = positive Constitutional:    weight loss, night sweats, fevers, chills, +fatigue, lassitude. HEENT:    headaches, difficulty swallowing, tooth/dental problems, sore throat,       sneezing, itching, ear ache, nasal congestion, post nasal drip, snoring CV:    chest pain, orthopnea, PND, swelling in lower extremities, anasarca,          dizziness, palpitations Resp:   +shortness of breath with exertion or at rest.                productive cough,   non-productive cough, coughing up of blood.              change in color of mucus.  +wheezing.   Skin:    rash or lesions. GI:  No-   heartburn, indigestion, abdominal pain, nausea, vomiting, diarrhea,                 change in bowel habits, loss of appetite GU: dysuria, change in color of urine, no urgency or frequency.   flank pain. MS:   joint pain, stiffness, decreased range of motion, back pain. Neuro-     nothing unusual Psych:  change in mood or affect.  depression or anxiety.   memory loss.  OBJ- Physical Exam General- Alert, Oriented, Affect-appropriate, Distress- none acute, +morbidly obese. Mildly tachypneic on O2 2L during quiet conversation. Skin- rash-none, lesions- none, excoriation- none Lymphadenopathy- none Head-  atraumatic            Eyes- Gross vision intact, PERRLA, conjunctivae and secretions clear            Ears- Hearing, canals-normal            Nose- Clear, no-Septal dev, mucus, polyps, erosion, perforation             Throat- Mallampati IV, mucosa clear , drainage- none, tonsils-absent Neck- flexible , trachea midline, no stridor , thyroid nl, carotid no bruit Chest - symmetrical excursion , unlabored           Heart/CV- RRR , no murmur , no gallop  , no rub, nl s1 s2                           -  JVD- none , edema+3, stasis changes+,  varices- none           Lung- clear to P&A, wheeze- none, cough- none , dullness-none, rub- none           Chest wall-  Abd-  Br/ Gen/ Rectal- Not done, not indicated Extrem- cyanosis- none, clubbing, none, atrophy- none, strength- nl Neuro- grossly intact to observation

## 2020-12-13 NOTE — Telephone Encounter (Signed)
Called and spoke with Abigail Vega at [facility]. She stated that she had sent over a referral for the patient to be seen but the patient is not scheduled to be seen until 01/16/21. 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   She was scheduled for a colonoscopy on 12/01/20 and the anesthesiologist noticed that her O2 levels dropped severely low during the procedure. Per Abigail Vega, her O2 dropped to 75%. They did not feel comfortable with proceeding with the procedure and the patient had a difficult time waking up. She was admitted into the hospital for observation. She stayed there for 2 days and was discharged on 2L of O2 24/7.   Abigail Vega has ordered a sleep study to rule out OSA. It has not been scheduled yet.   Patient does have a history of asthma but states she is only bothered with her asthma during allergy season.   She wanted to see if there was anyway we could work the patient in to be seen sooner.   I reviewed her hospital stay and she was not seen by any from our group in the hospital so I can not schedule her with an NP.   Dr. Annamaria Vega, you have an opening tomorrow at 930am. Would you be ok with seeing her as a consult?

## 2020-12-14 ENCOUNTER — Encounter: Payer: Self-pay | Admitting: Internal Medicine

## 2020-12-14 ENCOUNTER — Ambulatory Visit: Payer: BC Managed Care – PPO | Admitting: Internal Medicine

## 2020-12-14 ENCOUNTER — Other Ambulatory Visit: Payer: Self-pay

## 2020-12-14 ENCOUNTER — Telehealth: Payer: Self-pay

## 2020-12-14 ENCOUNTER — Telehealth: Payer: Self-pay | Admitting: Internal Medicine

## 2020-12-14 ENCOUNTER — Ambulatory Visit (INDEPENDENT_AMBULATORY_CARE_PROVIDER_SITE_OTHER): Payer: BC Managed Care – PPO

## 2020-12-14 VITALS — BP 122/86 | Temp 98.0°F | Ht 68.0 in | Wt 295.0 lb

## 2020-12-14 DIAGNOSIS — J9611 Chronic respiratory failure with hypoxia: Secondary | ICD-10-CM | POA: Diagnosis not present

## 2020-12-14 DIAGNOSIS — R0600 Dyspnea, unspecified: Secondary | ICD-10-CM | POA: Diagnosis not present

## 2020-12-14 DIAGNOSIS — Z6841 Body Mass Index (BMI) 40.0 and over, adult: Secondary | ICD-10-CM

## 2020-12-14 DIAGNOSIS — J4540 Moderate persistent asthma, uncomplicated: Secondary | ICD-10-CM

## 2020-12-14 LAB — BRAIN NATRIURETIC PEPTIDE: Pro B Natriuretic peptide (BNP): 28 pg/mL (ref 0.0–100.0)

## 2020-12-14 LAB — COMPREHENSIVE METABOLIC PANEL
ALT: 13 U/L (ref 0–35)
AST: 12 U/L (ref 0–37)
Albumin: 3.6 g/dL (ref 3.5–5.2)
Alkaline Phosphatase: 72 U/L (ref 39–117)
BUN: 12 mg/dL (ref 6–23)
CO2: 28 mEq/L (ref 19–32)
Calcium: 9.3 mg/dL (ref 8.4–10.5)
Chloride: 102 mEq/L (ref 96–112)
Creatinine, Ser: 0.77 mg/dL (ref 0.40–1.20)
GFR: 92.5 mL/min (ref >60.00)
Glucose, Bld: 83 mg/dL (ref 70–99)
Potassium: 4.3 mEq/L (ref 3.5–5.1)
Sodium: 140 mEq/L (ref 135–145)
Total Bilirubin: 0.4 mg/dL (ref 0.2–1.2)
Total Protein: 7.4 g/dL (ref 6.0–8.3)

## 2020-12-14 LAB — CBC WITH DIFFERENTIAL/PLATELET
Basophils Absolute: 0.1 10*3/uL (ref 0.0–0.1)
Basophils Relative: 0.6 % (ref 0.0–3.0)
Eosinophils Absolute: 0.5 10*3/uL (ref 0.0–0.7)
Eosinophils Relative: 4.1 % (ref 0.0–5.0)
HCT: 46.2 % — ABNORMAL HIGH (ref 36.0–46.0)
Hemoglobin: 14.4 g/dL (ref 12.0–15.0)
Lymphocytes Relative: 21.3 % (ref 12.0–46.0)
Lymphs Abs: 2.4 10*3/uL (ref 0.7–4.0)
MCHC: 31.1 g/dL (ref 30.0–36.0)
MCV: 89.5 fl (ref 78.0–100.0)
Monocytes Absolute: 0.8 10*3/uL (ref 0.1–1.0)
Monocytes Relative: 7.2 % (ref 3.0–12.0)
Neutro Abs: 7.4 10*3/uL (ref 1.4–7.7)
Neutrophils Relative %: 66.8 % (ref 43.0–77.0)
Platelets: 272 10*3/uL (ref 150.0–400.0)
RBC: 5.16 Mil/uL — ABNORMAL HIGH (ref 3.87–5.11)
RDW: 17.4 % — ABNORMAL HIGH (ref 11.5–15.5)
WBC: 11.1 10*3/uL — ABNORMAL HIGH (ref 4.0–10.5)

## 2020-12-14 LAB — D-DIMER, QUANTITATIVE: D-Dimer, Quant: 0.4 mcg/mL FEU (ref <0.50)

## 2020-12-14 NOTE — Assessment & Plan Note (Signed)
Referred to [facility]. This is the key problem for most of her health issues.

## 2020-12-14 NOTE — Telephone Encounter (Signed)
Patient called back from call for labwork. Went over [employer] with patient. Patient verbalized understanding, nothing further needed.

## 2020-12-14 NOTE — Assessment & Plan Note (Signed)
Probably chronic and incidentally detected at colonoscopy. Strongly agree with pending consultations with Dr Maxwell Caul for OSA and with Cardiology- likely has pulmonary hypertension. Peripheral edema and ground glass on CT suggest diuresis may reduce O2 requirement. Probably Obesity Hypoventilation syndrome could improve with weight loss. Asthma component managed by her Allergist and not evident on this exam. Plan- PFT, encourage walking. Recommend O2 2L for sleep to minimize CO2 retention, 3L with acitvity/ awake. Labs to recheck CXR, D-dimer, CBC, CMET.

## 2020-12-14 NOTE — Patient Instructions (Addendum)
Order- - DME [employer]- continue O2 2 L sleep, 3L exertion  Order- refer to [facility]    dx Morbid obesity  Order lab- CBC w diff, BNP, D-dimer, CMET  Order- CXR   dx Chronic Respiratory Failure with Hypoxia  Keep appointment with Dr Maxwell Caul for Sleep Apnea evaluation  Keep Cardiology appointment

## 2020-12-14 NOTE — Telephone Encounter (Signed)
Spoke with the pt and went over her AVS recs  She had not had the time to look at them yet  She denied any questions after I read her the instructions  Nothing further needed

## 2020-12-14 NOTE — Telephone Encounter (Signed)
Left message for patient to call back to inform her about her lab results.

## 2020-12-14 NOTE — Telephone Encounter (Signed)
-----  Message from Deneise Lever, MD sent at 12/14/2020  1:46 PM EDT ----- Labs-hemoglobin just a little high. Sometimes we see that when oxygen has been low. Other wise, chemistry tests ok and tests for fluid overload and for blood clots were normal.

## 2020-12-14 NOTE — Progress Notes (Signed)
LM 12/14/2020

## 2020-12-15 NOTE — Progress Notes (Signed)
Date:  12/18/2020   ID:  Abigail Vega, DOB [birth year], MRN [id number]  PCP:  Marda Stalker, PA-C  Cardiologist:  Rex Kras, DO, [facility]  (established care 12/18/2020)  REASON FOR CONSULT: Dyspnea  REQUESTING PHYSICIAN:  Lawerance Cruel, MD [place],  [place] [id number]  Chief Complaint  Patient presents with   Shortness of Breath   New Patient (Initial Visit)    HPI  Abigail Vega is a 45 y.o. 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  She is referred to the office at the request of Lawerance Cruel, MD for evaluation of dyspnea,.  After routine colonoscopy she was found to be hypoxic requiring oxygen and therefore was sent to the hospital for further evaluation and management.  In addition to laboratory work she also had a CT of the chest PE protocol was noted no evidence of pulmonary embolism but the study was limited due to body habitus and breathing motion artifact.  Diffuse groundglass opacities throughout the lungs with interlobar septal thickening, consistent with pulmonary edema and/or infection along with evidence of cardiomegaly and prominent mediastinal lymph nodes, likely reactive.  Patient saw Dr. Annamaria Boots from pulmonary medicine on December 14, 2020 and the differential diagnosis for chronic/incidental hypoxia obesity hypoventilatory syndrome, pulmonary hypertension is currently undergoing testing.  Labs independently reviewed from 12/14/2020.  proBNP 28.   Patient states that she has been experiencing shortness of breath mostly with effort related activities for at least 1 year or more.  Initially attributing it to underlying obesity as the symptoms would get better with resting and worse with effort  related activities.  She does not carry any prior diagnosis of congestive heart failure or coronary artery disease.  Approximately 1 month ago she had an episode of chest pain, over the left breast, intensity 2 out of 10, lasting for a minute or so, nonradiating, not brought on by effort related activities, did not resolve with rest, and self-limited.  Patient states that she was given diuretic therapy while she was in the hospital and does not recall if it significantly helped her symptoms.  For reasons unknown she used to have pneumonia growing up almost every year according to her.  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   No family history of premature coronary disease or sudden cardiac death.  FUNCTIONAL STATUS: No structured exercise program or daily routine.  ALLERGIES: No Known Allergies  MEDICATION LIST PRIOR TO VISIT: Current Meds  Medication Sig   albuterol (VENTOLIN HFA) 108 (90 Base) MCG/ACT inhaler Inhale 2 puffs into the lungs every 6 (six) hours as needed for wheezing or shortness of breath.   buPROPion (WELLBUTRIN XL) 150 MG 24 hr tablet Take 150 mg by mouth daily.   cetirizine (ZYRTEC) 10 MG tablet Take 10 mg by mouth daily.   EPINEPHrine 0.3 mg/0.3 mL IJ SOAJ injection Inject 0.3 mg into the muscle as needed for anaphylaxis.   furosemide (LASIX) 20 MG tablet Take 1 tablet (20 mg total) by mouth every morning for 7 days.   ibuprofen (ADVIL) 200 MG tablet Take 600 mg by mouth every 8 (eight) hours as needed for  moderate pain.   metFORMIN (GLUCOPHAGE) 500 MG tablet Take 1,000 mg by mouth daily with breakfast.   montelukast (SINGULAIR) 10 MG tablet Take 10 mg by mouth every morning.   norethindrone-ethinyl estradiol-iron (LOESTRIN FE) 1.5-30 MG-MCG tablet Take 1 tablet by mouth daily.   sertraline (ZOLOFT) 50 MG tablet Take 50 mg by mouth daily.   SUMAtriptan  (IMITREX) 100 MG tablet Take 100 mg by mouth every 2 (two) hours as needed for migraine. May repeat in 2 hours if headache persists or recurs.   UNABLE TO FIND Med Name: allergy injection every 3 weeks     PAST MEDICAL HISTORY: Past Medical History:  Diagnosis Date   Asthma    Depression    Diabetes mellitus without complication ([place])    Seasonal allergies     PAST SURGICAL HISTORY: Past Surgical History:  Procedure Laterality Date   COLONOSCOPY WITH PROPOFOL N/A 12/01/2020   Procedure: COLONOSCOPY WITH PROPOFOL;  Surgeon: Clarene Essex, MD;  Location: [facility];  Service: Endoscopy;  Laterality: N/A;   NASAL SEPTOPLASTY W/ TURBINOPLASTY  1991   TONSILECTOMY, ADENOIDECTOMY, BILATERAL MYRINGOTOMY AND TUBES  1981   TYMPANOPLASTY  1986    FAMILY HISTORY: Mom and dad are both alive.  No significant cardiac history.  Patient denies any premature CAD or sudden cardiac death in the family.  SOCIAL HISTORY:  The patient  reports that she has never smoked. She has never used smokeless tobacco. She reports that she does not drink alcohol and does not use drugs.  REVIEW OF SYSTEMS: Review of Systems  Constitutional: Negative for chills and fever.  HENT:  Negative for hoarse voice and nosebleeds.   Eyes:  Negative for discharge, double vision and pain.  Cardiovascular:  Positive for dyspnea on exertion. Negative for chest pain, claudication, leg swelling, near-syncope, orthopnea, palpitations, paroxysmal nocturnal dyspnea and syncope.  Respiratory:  Positive for shortness of breath. Negative for hemoptysis.   Musculoskeletal:  Negative for muscle cramps and myalgias.  Gastrointestinal:  Negative for abdominal pain, constipation, diarrhea, hematemesis, hematochezia, melena, nausea and vomiting.  Neurological:  Negative for dizziness and light-headedness.   PHYSICAL EXAM: Vitals with BMI 12/18/2020 12/14/2020 12/03/2020  Height _0  _1  -  Weight 402 lbs 295 lbs -  BMI 81.01 75.10 -   Systolic 258 527 99  Diastolic 75 86 75  Pulse 782 - 83    CONSTITUTIONAL: Appears older than stated age, nasal cannula oxygen, hemodynamically stable, no acute distress,   SKIN: Skin is warm and dry. No rash noted. No cyanosis. No pallor. No jaundice HEAD: Normocephalic and atraumatic.  EYES: No scleral icterus MOUTH/THROAT: Moist oral membranes.  NECK: JVD unable to evaluate due to neck stature and adipose tissue. No thyromegaly noted. No carotid bruits  LYMPHATIC: No visible cervical adenopathy.  CHEST Normal respiratory effort. No intercostal retractions  LUNGS: Decreased breath sounds bilaterally.  No stridor. No wheezes. No rales.  CARDIOVASCULAR: Regular, tachycardic, positive S1-S2, no murmurs rubs or gallops appreciated mostly secondary to tachycardia. ABDOMINAL: Obese, soft, nontender, nondistended, positive bowel sounds all 4 quadrants. No apparent ascites.  EXTREMITIES: Trace bilateral peripheral edema  HEMATOLOGIC: No significant bruising NEUROLOGIC: Oriented to person, place, and time. Nonfocal. Normal muscle tone.  PSYCHIATRIC: Normal mood and affect. Normal behavior. Cooperative  CARDIAC DATABASE: EKG: 12/18/2020: Normal sinus rhythm, 93 bpm, without underlying ischemia or injury pattern.  Echocardiogram: No results found for this or any previous visit from the past 1095 days.   Stress Testing: No results found  for this or any previous visit from the past 1095 days.  Heart Catheterization: None  LABORATORY DATA: CBC Latest Ref Rng & Units 12/14/2020 12/02/2020 12/01/2020  WBC 4.0 - 10.5 K/uL 11.1(H) 9.6 9.1  Hemoglobin 12.0 - 15.0 g/dL 14.4 14.8 15.2(H)  Hematocrit 36.0 - 46.0 % 46.2(H) 49.2(H) 50.2(H)  Platelets 150.0 - 400.0 K/uL 272.0 307 292    CMP Latest Ref Rng & Units 12/14/2020 12/02/2020 12/01/2020  Glucose 70 - 99 mg/dL 83 104(H) 84  BUN 6 - 23 mg/dL _0 Creatinine 0.40 - 1.20 mg/dL 0.77 0.85 0.88  Sodium 135 - 145 mEq/L 140 139 137  Potassium 3.5  - 5.1 mEq/L 4.3 4.1 4.0  Chloride 96 - 112 mEq/L 102 101 101  CO2 19 - 32 mEq/L _1 Calcium 8.4 - 10.5 mg/dL 9.3 9.1 8.6(L)  Total Protein 6.0 - 8.3 g/dL 7.4 7.9 -  Total Bilirubin 0.2 - 1.2 mg/dL 0.4 0.6 -  Alkaline Phos 39 - 117 U/L 72 55 -  AST 0 - 37 U/L 12 15 -  ALT 0 - 35 U/L 13 15 -    Lipid Panel  No results found for: CHOL, TRIG, HDL, CHOLHDL, VLDL, LDLCALC, LDLDIRECT, LABVLDL  No components found for: NTPROBNP Recent Labs    12/14/20 1030  PROBNP 28.0   No results for input(s): TSH in the last 8760 hours.  BMP Recent Labs    12/01/20 1454 12/02/20 0312 12/14/20 1030  NA 137 139 140  K 4.0 4.1 4.3  CL 101 101 102  CO2 _2 GLUCOSE 84 104* 83  BUN _3 CREATININE 0.88 0.85 0.77  CALCIUM 8.6* 9.1 9.3  GFRNONAA >60 >60  --     HEMOGLOBIN A1C Lab Results  Component Value Date   HGBA1C 6.5 (H) 12/02/2020   MPG 139.85 12/02/2020   External Labs: Collected: 08/03/2020 Creatinine 0.79 mg/dL. eGFR: 78 mL/min per 1.73 m Potassium 4.4 AST 10, ALT 11, alkaline phosphatase 82 (all within normal limits) Total cholesterol 182, triglycerides 125, HDL 43, LDL 116 Hemoglobin A1c: 6.1  IMPRESSION:    ICD-10-CM   1. Shortness of breath  R06.02 EKG 12-Lead    PCV ECHOCARDIOGRAM COMPLETE    furosemide (LASIX) 20 MG tablet    Basic metabolic panel    Magnesium    2. Supplemental oxygen dependent  Z99.81     3. Diabetes mellitus without complication ([place])  B35.3     4. Class 3 severe obesity due to excess calories with serious comorbidity and body mass index (BMI) of 60.0 to 69.9 in adult [facility])  E66.01    Z68.44        RECOMMENDATIONS: Abigail Vega is a 45 y.o. female whose past medical history and cardiac risk factors include: migraines, depression, type 2 diabetes, Obesity due to excess calories, history of pneumonia since childhood.  Dyspnea: Recently placed on 2 L nasal cannula oxygen after and noted to be hypoxic status post  colonoscopy. Hospital records as well as pulmonary consult note reviewed. Her symptoms are multifactorial and now referred to cardiology for further evaluation & management. Echocardiogram will be ordered to evaluate for structural heart disease and left ventricular systolic function. Clinical suspicion for underlying heart failure is low, BNP within normal limits, clinically not volume overloaded. Reviewed the CTA images.  The pulmonary artery appears to be normal caliber without any significant dilatation. Shared decision was to proceed with ischemic evaluation given the  degree of dyspnea and hypoxia.   Patient is unable to exercise as she currently requires oxygen at resting.  Not an ideal candidate for stress test as she is predisposed to soft tissue attenuation artifact due to breast and abdominal obesity (BMI 61).  Coronary CTA can be considered however, she has had prior CT studies in which the images were suboptimal due to body habitus and motion artifact.  Therefore we discussed considering left and right heart catheterization to evaluate for obstructive CAD and pulmonary hypertension given her symptoms of dyspnea and the degree of hypoxia.   The procedure of left and right heart catheterization with possible intervention was explained to the patient in   detail. The indication, alternatives, risks and benefits were reviewed.  Complications include but not limited to bleeding, infection, vascular injury, stroke, myocardial infection, arrhythmia, kidney injury requiring hemodialysis, pacemaker, radiation-related injury in the case of prolonged fluoroscopy use, emergency cardiac surgery, and death. The patient understands the risks of serious complication is 1-2 in 8144 with diagnostic cardiac cath and 1-2% or less with angioplasty/stenting.  The patient voices understanding and provides verbal feedback and wishes to proceed with left and right heart catheterization with possible PCI. However, she  will discuss it further with her parents prior to proceeding and will call us back.  Trial of Lasix 20 mg p.o. daily for blood work in 1 week to evaluate kidney function and electrolytes.  Non-insulin-dependent diabetes mellitus type 2:  Currently on antiglycemic agents.   Currently managed by primary care provider.   Recommended statin therapy given her diagnosis of diabetes.   Patient states that she will discuss it further with PCP prior to initiating therapy.   FINAL MEDICATION LIST END OF ENCOUNTER: Meds ordered this encounter  Medications   furosemide (LASIX) 20 MG tablet    Sig: Take 1 tablet (20 mg total) by mouth every morning for 7 days.    Dispense:  7 tablet    Refill:  0     There are no discontinued medications.   Current Outpatient Medications:    albuterol (VENTOLIN HFA) 108 (90 Base) MCG/ACT inhaler, Inhale 2 puffs into the lungs every 6 (six) hours as needed for wheezing or shortness of breath., Disp: , Rfl:    buPROPion (WELLBUTRIN XL) 150 MG 24 hr tablet, Take 150 mg by mouth daily., Disp: , Rfl:    cetirizine (ZYRTEC) 10 MG tablet, Take 10 mg by mouth daily., Disp: , Rfl:    EPINEPHrine 0.3 mg/0.3 mL IJ SOAJ injection, Inject 0.3 mg into the muscle as needed for anaphylaxis., Disp: , Rfl:    furosemide (LASIX) 20 MG tablet, Take 1 tablet (20 mg total) by mouth every morning for 7 days., Disp: 7 tablet, Rfl: 0   ibuprofen (ADVIL) 200 MG tablet, Take 600 mg by mouth every 8 (eight) hours as needed for moderate pain., Disp: , Rfl:    metFORMIN (GLUCOPHAGE) 500 MG tablet, Take 1,000 mg by mouth daily with breakfast., Disp: , Rfl:    montelukast (SINGULAIR) 10 MG tablet, Take 10 mg by mouth every morning., Disp: , Rfl:    norethindrone-ethinyl estradiol-iron (LOESTRIN FE) 1.5-30 MG-MCG tablet, Take 1 tablet by mouth daily., Disp: , Rfl:    sertraline (ZOLOFT) 50 MG tablet, Take 50 mg by mouth daily., Disp: , Rfl:    SUMAtriptan (IMITREX) 100 MG tablet, Take 100 mg by  mouth every 2 (two) hours as needed for migraine. May repeat in 2 hours if headache persists or  recurs., Disp: , Rfl:    UNABLE TO FIND, Med Name: allergy injection every 3 weeks, Disp: , Rfl:   Orders Placed This Encounter  Procedures   Basic metabolic panel   Magnesium   EKG 12-Lead   PCV ECHOCARDIOGRAM COMPLETE    There are no Patient Instructions on file for this visit.   --Continue cardiac medications as reconciled in final medication list. --Return in about 2 weeks (around 01/01/2021) for Follow up, Dyspnea, Post heart catheterization. Or sooner if needed. --Continue follow-up with your primary care physician regarding the management of your other chronic comorbid conditions.  Patient's questions and concerns were addressed to her satisfaction. She voices understanding of the instructions provided during this encounter.   This note was created using a voice recognition software as a result there may be grammatical errors inadvertently enclosed that do not reflect the nature of this encounter. Every attempt is made to correct such errors.  Rex Kras, [place], [facility]  Pager: [phone] Office: [phone]

## 2020-12-15 NOTE — H&P (View-Only) (Signed)
Date:  12/18/2020   ID:  Abigail O Sandberg, DOB [birth year], MRN [id number]  PCP:  Marda Stalker, PA-C  Cardiologist:  Rex Kras, DO, [facility]  (established care 12/18/2020)  REASON FOR CONSULT: Dyspnea  REQUESTING PHYSICIAN:  Lawerance Cruel, MD [place],  [place] [address]  Chief Complaint  Patient presents with   Shortness of Breath   New Patient (Initial Visit)    HPI  Abigail Vega is a 45 y.o. 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  She is referred to the office at the request of Lawerance Cruel, MD for evaluation of dyspnea,.  After routine colonoscopy she was found to be hypoxic requiring oxygen and therefore was sent to the hospital for further evaluation and management.  In addition to laboratory work she also had a CT of the chest PE protocol was noted no evidence of pulmonary embolism but the study was limited due to body habitus and breathing motion artifact.  Diffuse groundglass opacities throughout the lungs with interlobar septal thickening, consistent with pulmonary edema and/or infection along with evidence of cardiomegaly and prominent mediastinal lymph nodes, likely reactive.  Patient saw Dr. Annamaria Boots from pulmonary medicine on December 14, 2020 and the differential diagnosis for chronic/incidental hypoxia obesity hypoventilatory syndrome, pulmonary hypertension is currently undergoing testing.  Labs independently reviewed from 12/14/2020.  proBNP 28.   Patient states that she has been experiencing shortness of breath mostly with effort related activities for at least 1 year or more.  Initially attributing it to underlying obesity as the symptoms would get better with resting and worse with effort  related activities.  She does not carry any prior diagnosis of congestive heart failure or coronary artery disease.  Approximately 1 month ago she had an episode of chest pain, over the left breast, intensity 2 out of 10, lasting for a minute or so, nonradiating, not brought on by effort related activities, did not resolve with rest, and self-limited.  Patient states that she was given diuretic therapy while she was in the hospital and does not recall if it significantly helped her symptoms.  For reasons unknown she used to have pneumonia growing up almost every year according to her.  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   No family history of premature coronary disease or sudden cardiac death.  FUNCTIONAL STATUS: No structured exercise program or daily routine.  ALLERGIES: No Known Allergies  MEDICATION LIST PRIOR TO VISIT: Current Meds  Medication Sig   albuterol (VENTOLIN HFA) 108 (90 Base) MCG/ACT inhaler Inhale 2 puffs into the lungs every 6 (six) hours as needed for wheezing or shortness of breath.   buPROPion (WELLBUTRIN XL) 150 MG 24 hr tablet Take 150 mg by mouth daily.   cetirizine (ZYRTEC) 10 MG tablet Take 10 mg by mouth daily.   EPINEPHrine 0.3 mg/0.3 mL IJ SOAJ injection Inject 0.3 mg into the muscle as needed for anaphylaxis.   furosemide (LASIX) 20 MG tablet Take 1 tablet (20 mg total) by mouth every morning for 7 days.   ibuprofen (ADVIL) 200 MG tablet Take 600 mg by mouth every 8 (eight) hours as needed for  moderate pain.   metFORMIN (GLUCOPHAGE) 500 MG tablet Take 1,000 mg by mouth daily with breakfast.   montelukast (SINGULAIR) 10 MG tablet Take 10 mg by mouth every morning.   norethindrone-ethinyl estradiol-iron (LOESTRIN FE) 1.5-30 MG-MCG tablet Take 1 tablet by mouth daily.   sertraline (ZOLOFT) 50 MG tablet Take 50 mg by mouth daily.   SUMAtriptan  (IMITREX) 100 MG tablet Take 100 mg by mouth every 2 (two) hours as needed for migraine. May repeat in 2 hours if headache persists or recurs.   UNABLE TO FIND Med Name: allergy injection every 3 weeks     PAST MEDICAL HISTORY: Past Medical History:  Diagnosis Date   Asthma    Depression    Diabetes mellitus without complication ([place])    Seasonal allergies     PAST SURGICAL HISTORY: Past Surgical History:  Procedure Laterality Date   COLONOSCOPY WITH PROPOFOL N/A 12/01/2020   Procedure: COLONOSCOPY WITH PROPOFOL;  Surgeon: Clarene Essex, MD;  Location: [facility];  Service: Endoscopy;  Laterality: N/A;   NASAL SEPTOPLASTY W/ TURBINOPLASTY  1991   TONSILECTOMY, ADENOIDECTOMY, BILATERAL MYRINGOTOMY AND TUBES  1981   TYMPANOPLASTY  1986    FAMILY HISTORY: Mom and dad are both alive.  No significant cardiac history.  Patient denies any premature CAD or sudden cardiac death in the family.  SOCIAL HISTORY:  The patient  reports that she has never smoked. She has never used smokeless tobacco. She reports that she does not drink alcohol and does not use drugs.  REVIEW OF SYSTEMS: Review of Systems  Constitutional: Negative for chills and fever.  HENT:  Negative for hoarse voice and nosebleeds.   Eyes:  Negative for discharge, double vision and pain.  Cardiovascular:  Positive for dyspnea on exertion. Negative for chest pain, claudication, leg swelling, near-syncope, orthopnea, palpitations, paroxysmal nocturnal dyspnea and syncope.  Respiratory:  Positive for shortness of breath. Negative for hemoptysis.   Musculoskeletal:  Negative for muscle cramps and myalgias.  Gastrointestinal:  Negative for abdominal pain, constipation, diarrhea, hematemesis, hematochezia, melena, nausea and vomiting.  Neurological:  Negative for dizziness and light-headedness.   PHYSICAL EXAM: Vitals with BMI 12/18/2020 12/14/2020 12/03/2020  Height _0  _1  -  Weight 402 lbs 295 lbs -  BMI 81.01 75.10 -   Systolic 258 527 99  Diastolic 75 86 75  Pulse 782 - 83    CONSTITUTIONAL: Appears older than stated age, nasal cannula oxygen, hemodynamically stable, no acute distress,   SKIN: Skin is warm and dry. No rash noted. No cyanosis. No pallor. No jaundice HEAD: Normocephalic and atraumatic.  EYES: No scleral icterus MOUTH/THROAT: Moist oral membranes.  NECK: JVD unable to evaluate due to neck stature and adipose tissue. No thyromegaly noted. No carotid bruits  LYMPHATIC: No visible cervical adenopathy.  CHEST Normal respiratory effort. No intercostal retractions  LUNGS: Decreased breath sounds bilaterally.  No stridor. No wheezes. No rales.  CARDIOVASCULAR: Regular, tachycardic, positive S1-S2, no murmurs rubs or gallops appreciated mostly secondary to tachycardia. ABDOMINAL: Obese, soft, nontender, nondistended, positive bowel sounds all 4 quadrants. No apparent ascites.  EXTREMITIES: Trace bilateral peripheral edema  HEMATOLOGIC: No significant bruising NEUROLOGIC: Oriented to person, place, and time. Nonfocal. Normal muscle tone.  PSYCHIATRIC: Normal mood and affect. Normal behavior. Cooperative  CARDIAC DATABASE: EKG: 12/18/2020: Normal sinus rhythm, 93 bpm, without underlying ischemia or injury pattern.  Echocardiogram: No results found for this or any previous visit from the past 1095 days.   Stress Testing: No results found  for this or any previous visit from the past 1095 days.  Heart Catheterization: None  LABORATORY DATA: CBC Latest Ref Rng & Units 12/14/2020 12/02/2020 12/01/2020  WBC 4.0 - 10.5 K/uL 11.1(H) 9.6 9.1  Hemoglobin 12.0 - 15.0 g/dL 14.4 14.8 15.2(H)  Hematocrit 36.0 - 46.0 % 46.2(H) 49.2(H) 50.2(H)  Platelets 150.0 - 400.0 K/uL 272.0 307 292    CMP Latest Ref Rng & Units 12/14/2020 12/02/2020 12/01/2020  Glucose 70 - 99 mg/dL 83 104(H) 84  BUN 6 - 23 mg/dL _0 Creatinine 0.40 - 1.20 mg/dL 0.77 0.85 0.88  Sodium 135 - 145 mEq/L 140 139 137  Potassium 3.5  - 5.1 mEq/L 4.3 4.1 4.0  Chloride 96 - 112 mEq/L 102 101 101  CO2 19 - 32 mEq/L _1 Calcium 8.4 - 10.5 mg/dL 9.3 9.1 8.6(L)  Total Protein 6.0 - 8.3 g/dL 7.4 7.9 -  Total Bilirubin 0.2 - 1.2 mg/dL 0.4 0.6 -  Alkaline Phos 39 - 117 U/L 72 55 -  AST 0 - 37 U/L 12 15 -  ALT 0 - 35 U/L 13 15 -    Lipid Panel  No results found for: CHOL, TRIG, HDL, CHOLHDL, VLDL, LDLCALC, LDLDIRECT, LABVLDL  No components found for: NTPROBNP Recent Labs    12/14/20 1030  PROBNP 28.0   No results for input(s): TSH in the last 8760 hours.  BMP Recent Labs    12/01/20 1454 12/02/20 0312 12/14/20 1030  NA 137 139 140  K 4.0 4.1 4.3  CL 101 101 102  CO2 _2 GLUCOSE 84 104* 83  BUN _3 CREATININE 0.88 0.85 0.77  CALCIUM 8.6* 9.1 9.3  GFRNONAA >60 >60  --     HEMOGLOBIN A1C Lab Results  Component Value Date   HGBA1C 6.5 (H) 12/02/2020   MPG 139.85 12/02/2020   External Labs: Collected: 08/03/2020 Creatinine 0.79 mg/dL. eGFR: 78 mL/min per 1.73 m Potassium 4.4 AST 10, ALT 11, alkaline phosphatase 82 (all within normal limits) Total cholesterol 182, triglycerides 125, HDL 43, LDL 116 Hemoglobin A1c: 6.1  IMPRESSION:    ICD-10-CM   1. Shortness of breath  R06.02 EKG 12-Lead    PCV ECHOCARDIOGRAM COMPLETE    furosemide (LASIX) 20 MG tablet    Basic metabolic panel    Magnesium    2. Supplemental oxygen dependent  Z99.81     3. Diabetes mellitus without complication ([place])  B35.3     4. Class 3 severe obesity due to excess calories with serious comorbidity and body mass index (BMI) of 60.0 to 69.9 in adult [facility])  E66.01    Z68.44        RECOMMENDATIONS: Abigail O Perales is a 45 y.o. female whose past medical history and cardiac risk factors include: migraines, depression, type 2 diabetes, Obesity due to excess calories, history of pneumonia since childhood.  Dyspnea: Recently placed on 2 L nasal cannula oxygen after and noted to be hypoxic status post  colonoscopy. Hospital records as well as pulmonary consult note reviewed. Her symptoms are multifactorial and now referred to cardiology for further evaluation & management. Echocardiogram will be ordered to evaluate for structural heart disease and left ventricular systolic function. Clinical suspicion for underlying heart failure is low, BNP within normal limits, clinically not volume overloaded. Reviewed the CTA images.  The pulmonary artery appears to be normal caliber without any significant dilatation. Shared decision was to proceed with ischemic evaluation given the  degree of dyspnea and hypoxia.   Patient is unable to exercise as she currently requires oxygen at resting.  Not an ideal candidate for stress test as she is predisposed to soft tissue attenuation artifact due to breast and abdominal obesity (BMI 61).  Coronary CTA can be considered however, she has had prior CT studies in which the images were suboptimal due to body habitus and motion artifact.  Therefore we discussed considering left and right heart catheterization to evaluate for obstructive CAD and pulmonary hypertension given her symptoms of dyspnea and the degree of hypoxia.   The procedure of left and right heart catheterization with possible intervention was explained to the patient in   detail. The indication, alternatives, risks and benefits were reviewed.  Complications include but not limited to bleeding, infection, vascular injury, stroke, myocardial infection, arrhythmia, kidney injury requiring hemodialysis, pacemaker, radiation-related injury in the case of prolonged fluoroscopy use, emergency cardiac surgery, and death. The patient understands the risks of serious complication is 1-2 in 8144 with diagnostic cardiac cath and 1-2% or less with angioplasty/stenting.  The patient voices understanding and provides verbal feedback and wishes to proceed with left and right heart catheterization with possible PCI. However, she  will discuss it further with her parents prior to proceeding and will call us back.  Trial of Lasix 20 mg p.o. daily for blood work in 1 week to evaluate kidney function and electrolytes.  Non-insulin-dependent diabetes mellitus type 2:  Currently on antiglycemic agents.   Currently managed by primary care provider.   Recommended statin therapy given her diagnosis of diabetes.   Patient states that she will discuss it further with PCP prior to initiating therapy.   FINAL MEDICATION LIST END OF ENCOUNTER: Meds ordered this encounter  Medications   furosemide (LASIX) 20 MG tablet    Sig: Take 1 tablet (20 mg total) by mouth every morning for 7 days.    Dispense:  7 tablet    Refill:  0     There are no discontinued medications.   Current Outpatient Medications:    albuterol (VENTOLIN HFA) 108 (90 Base) MCG/ACT inhaler, Inhale 2 puffs into the lungs every 6 (six) hours as needed for wheezing or shortness of breath., Disp: , Rfl:    buPROPion (WELLBUTRIN XL) 150 MG 24 hr tablet, Take 150 mg by mouth daily., Disp: , Rfl:    cetirizine (ZYRTEC) 10 MG tablet, Take 10 mg by mouth daily., Disp: , Rfl:    EPINEPHrine 0.3 mg/0.3 mL IJ SOAJ injection, Inject 0.3 mg into the muscle as needed for anaphylaxis., Disp: , Rfl:    furosemide (LASIX) 20 MG tablet, Take 1 tablet (20 mg total) by mouth every morning for 7 days., Disp: 7 tablet, Rfl: 0   ibuprofen (ADVIL) 200 MG tablet, Take 600 mg by mouth every 8 (eight) hours as needed for moderate pain., Disp: , Rfl:    metFORMIN (GLUCOPHAGE) 500 MG tablet, Take 1,000 mg by mouth daily with breakfast., Disp: , Rfl:    montelukast (SINGULAIR) 10 MG tablet, Take 10 mg by mouth every morning., Disp: , Rfl:    norethindrone-ethinyl estradiol-iron (LOESTRIN FE) 1.5-30 MG-MCG tablet, Take 1 tablet by mouth daily., Disp: , Rfl:    sertraline (ZOLOFT) 50 MG tablet, Take 50 mg by mouth daily., Disp: , Rfl:    SUMAtriptan (IMITREX) 100 MG tablet, Take 100 mg by  mouth every 2 (two) hours as needed for migraine. May repeat in 2 hours if headache persists or  recurs., Disp: , Rfl:    UNABLE TO FIND, Med Name: allergy injection every 3 weeks, Disp: , Rfl:   Orders Placed This Encounter  Procedures   Basic metabolic panel   Magnesium   EKG 12-Lead   PCV ECHOCARDIOGRAM COMPLETE    There are no Patient Instructions on file for this visit.   --Continue cardiac medications as reconciled in final medication list. --Return in about 2 weeks (around 01/01/2021) for Follow up, Dyspnea, Post heart catheterization. Or sooner if needed. --Continue follow-up with your primary care physician regarding the management of your other chronic comorbid conditions.  Patient's questions and concerns were addressed to her satisfaction. She voices understanding of the instructions provided during this encounter.   This note was created using a voice recognition software as a result there may be grammatical errors inadvertently enclosed that do not reflect the nature of this encounter. Every attempt is made to correct such errors.  Rex Kras, [place], [facility]  Pager: [phone] Office: [phone]

## 2020-12-15 NOTE — Progress Notes (Signed)
Spoke with pt and notified of results per Dr. Annamaria Boots Pt verbalized understanding and denied any questions.

## 2020-12-18 ENCOUNTER — Other Ambulatory Visit: Payer: Self-pay

## 2020-12-18 ENCOUNTER — Encounter: Payer: Self-pay | Admitting: Cardiology

## 2020-12-18 ENCOUNTER — Ambulatory Visit: Payer: BC Managed Care – PPO | Admitting: Cardiology

## 2020-12-18 VITALS — BP 131/75 | HR 100 | Temp 98.0°F | Resp 16 | Ht 68.0 in | Wt >= 6400 oz

## 2020-12-18 DIAGNOSIS — E66813 Obesity, class 3: Secondary | ICD-10-CM

## 2020-12-18 DIAGNOSIS — R0602 Shortness of breath: Secondary | ICD-10-CM

## 2020-12-18 DIAGNOSIS — Z9981 Dependence on supplemental oxygen: Secondary | ICD-10-CM

## 2020-12-18 DIAGNOSIS — E119 Type 2 diabetes mellitus without complications: Secondary | ICD-10-CM

## 2020-12-18 DIAGNOSIS — Z6841 Body Mass Index (BMI) 40.0 and over, adult: Secondary | ICD-10-CM

## 2020-12-18 MED ORDER — FUROSEMIDE 20 MG PO TABS
20.0000 mg | ORAL_TABLET | Freq: Every morning | ORAL | 0 refills | Status: DC
Start: 2020-12-18 — End: 2021-01-02

## 2020-12-20 ENCOUNTER — Other Ambulatory Visit: Payer: BC Managed Care – PPO

## 2020-12-26 ENCOUNTER — Other Ambulatory Visit: Payer: Self-pay

## 2020-12-26 DIAGNOSIS — R0602 Shortness of breath: Secondary | ICD-10-CM

## 2020-12-27 ENCOUNTER — Other Ambulatory Visit: Payer: Self-pay | Admitting: Cardiology

## 2020-12-27 ENCOUNTER — Encounter (HOSPITAL_BASED_OUTPATIENT_CLINIC_OR_DEPARTMENT_OTHER): Payer: Self-pay

## 2020-12-27 ENCOUNTER — Telehealth: Payer: Self-pay | Admitting: Internal Medicine

## 2020-12-27 DIAGNOSIS — R0602 Shortness of breath: Secondary | ICD-10-CM

## 2020-12-27 DIAGNOSIS — J9611 Chronic respiratory failure with hypoxia: Secondary | ICD-10-CM

## 2020-12-27 DIAGNOSIS — G4710 Hypersomnia, unspecified: Secondary | ICD-10-CM

## 2020-12-27 DIAGNOSIS — R0683 Snoring: Secondary | ICD-10-CM

## 2020-12-27 LAB — CBC WITH DIFFERENTIAL
Basophils Absolute: 0 10*3/uL (ref 0.0–0.2)
Basos: 0 %
EOS (ABSOLUTE): 0.3 10*3/uL (ref 0.0–0.4)
Eos: 3 %
Hematocrit: 44.2 % (ref 34.0–46.6)
Hemoglobin: 14.3 g/dL (ref 11.1–15.9)
Immature Grans (Abs): 0 10*3/uL (ref 0.0–0.1)
Immature Granulocytes: 0 %
Lymphocytes Absolute: 2.4 10*3/uL (ref 0.7–3.1)
Lymphs: 22 %
MCH: 28.3 pg (ref 26.6–33.0)
MCHC: 32.4 g/dL (ref 31.5–35.7)
MCV: 88 fL (ref 79–97)
Monocytes Absolute: 0.7 10*3/uL (ref 0.1–0.9)
Monocytes: 6 %
Neutrophils Absolute: 7.4 10*3/uL — ABNORMAL HIGH (ref 1.4–7.0)
Neutrophils: 69 %
RBC: 5.05 x10E6/uL (ref 3.77–5.28)
RDW: 15.7 % — ABNORMAL HIGH (ref 11.7–15.4)
WBC: 10.8 10*3/uL (ref 3.4–10.8)

## 2020-12-27 LAB — BASIC METABOLIC PANEL
BUN/Creatinine Ratio: 14 (ref 9–23)
BUN: 10 mg/dL (ref 6–24)
CO2: 28 mmol/L (ref 20–29)
Calcium: 8.9 mg/dL (ref 8.7–10.2)
Chloride: 101 mmol/L (ref 96–106)
Creatinine, Ser: 0.7 mg/dL (ref 0.57–1.00)
Glucose: 67 mg/dL (ref 65–99)
Potassium: 4.3 mmol/L (ref 3.5–5.2)
Sodium: 145 mmol/L — ABNORMAL HIGH (ref 134–144)
eGFR: 108 mL/min/{1.73_m2} (ref >59)

## 2020-12-27 LAB — MAGNESIUM: Magnesium: 1.9 mg/dL (ref 1.6–2.3)

## 2020-12-27 NOTE — Telephone Encounter (Signed)
Called and spoke with Patient.  Patient aware of Dr. Janee Morn orders.  DME order placed.  Nothing further at this time.

## 2020-12-27 NOTE — Telephone Encounter (Signed)
Please order DME [employer]- Portable O2 concentrator 3 L pulse, for dx Chronic Respiratory Failure with Hypoxia

## 2020-12-27 NOTE — Progress Notes (Signed)
ech

## 2020-12-27 NOTE — Telephone Encounter (Signed)
Patient requested a POC prescription to be sent to her DME.  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 Patient stated she has metal tanks, but they only last 4 hours.  Patient stated she uses 2-3 liters O2. Patient DME is [employer].   Message routed to Dr. Annamaria Boots to advised  No Known Allergies;  Current Outpatient Medications on File Prior to Visit  Medication Sig Dispense Refill   albuterol (VENTOLIN HFA) 108 (90 Base) MCG/ACT inhaler Inhale 2 puffs into the lungs every 6 (six) hours as needed for wheezing or shortness of breath.     buPROPion (WELLBUTRIN XL) 150 MG 24 hr tablet Take 150 mg by mouth daily.     cetirizine (ZYRTEC) 10 MG tablet Take 10 mg by mouth daily.     EPINEPHrine 0.3 mg/0.3 mL IJ SOAJ injection Inject 0.3 mg into the muscle as needed for anaphylaxis.     furosemide (LASIX) 20 MG tablet Take 1 tablet (20 mg total) by mouth every morning for 7 days. (Patient not taking: Reported on 12/27/2020) 7 tablet 0   ibuprofen (ADVIL) 200 MG tablet Take 600 mg by mouth every 8 (eight) hours as needed for moderate pain.     metFORMIN (GLUCOPHAGE) 500 MG tablet Take 1,000 mg by mouth daily with breakfast.     montelukast (SINGULAIR) 10 MG tablet Take 10 mg by mouth every morning.     norethindrone-ethinyl estradiol-iron (LOESTRIN FE) 1.5-30 MG-MCG tablet Take 1 tablet by mouth daily.     rosuvastatin (CRESTOR) 10 MG tablet Take 10 mg by mouth daily.     sertraline (ZOLOFT) 50 MG tablet Take 50 mg by mouth daily.     SUMAtriptan (IMITREX) 100 MG tablet Take 100 mg by mouth every 2 (two) hours as needed for migraine. May repeat in 2 hours if headache persists or recurs.     UNABLE TO FIND See admin instructions. Med Name: allergy injection every 3 weeks     No current facility-administered medications on file prior to visit.

## 2021-01-01 DIAGNOSIS — R0609 Other forms of dyspnea: Secondary | ICD-10-CM | POA: Diagnosis present

## 2021-01-01 DIAGNOSIS — R0600 Dyspnea, unspecified: Secondary | ICD-10-CM | POA: Diagnosis present

## 2021-01-02 ENCOUNTER — Encounter (HOSPITAL_COMMUNITY)
Admission: RE | Disposition: A | Discharge status: Home / Self Care | Payer: Self-pay | Source: Home / Self Care | Attending: Cardiology

## 2021-01-02 ENCOUNTER — Ambulatory Visit (HOSPITAL_COMMUNITY)
Admission: RE | Admit: 2021-01-02 | Discharge: 2021-01-02 | Disposition: A | Discharge status: Home / Self Care | Payer: BC Managed Care – PPO | Attending: Cardiology | Admitting: Cardiology

## 2021-01-02 ENCOUNTER — Encounter (HOSPITAL_COMMUNITY): Payer: Self-pay | Admitting: Cardiology

## 2021-01-02 ENCOUNTER — Other Ambulatory Visit: Payer: Self-pay

## 2021-01-02 DIAGNOSIS — Z7984 Long term (current) use of oral hypoglycemic drugs: Secondary | ICD-10-CM | POA: Insufficient documentation

## 2021-01-02 DIAGNOSIS — R0600 Dyspnea, unspecified: Secondary | ICD-10-CM | POA: Diagnosis present

## 2021-01-02 DIAGNOSIS — E119 Type 2 diabetes mellitus without complications: Secondary | ICD-10-CM | POA: Insufficient documentation

## 2021-01-02 DIAGNOSIS — Z9981 Dependence on supplemental oxygen: Secondary | ICD-10-CM | POA: Diagnosis not present

## 2021-01-02 DIAGNOSIS — F329 Major depressive disorder, single episode, unspecified: Secondary | ICD-10-CM | POA: Diagnosis not present

## 2021-01-02 DIAGNOSIS — J9611 Chronic respiratory failure with hypoxia: Secondary | ICD-10-CM | POA: Diagnosis present

## 2021-01-02 DIAGNOSIS — R0609 Other forms of dyspnea: Secondary | ICD-10-CM | POA: Diagnosis not present

## 2021-01-02 DIAGNOSIS — I2729 Other secondary pulmonary hypertension: Secondary | ICD-10-CM | POA: Insufficient documentation

## 2021-01-02 DIAGNOSIS — Z6841 Body Mass Index (BMI) 40.0 and over, adult: Secondary | ICD-10-CM | POA: Diagnosis not present

## 2021-01-02 DIAGNOSIS — Z006 Encounter for examination for normal comparison and control in clinical research program: Secondary | ICD-10-CM

## 2021-01-02 DIAGNOSIS — Z79899 Other long term (current) drug therapy: Secondary | ICD-10-CM | POA: Insufficient documentation

## 2021-01-02 HISTORY — PX: RIGHT/LEFT HEART CATH AND CORONARY ANGIOGRAPHY: CATH118266

## 2021-01-02 LAB — POCT I-STAT EG7
Acid-Base Excess: 8 mmol/L — ABNORMAL HIGH (ref 0.0–2.0)
Acid-Base Excess: 8 mmol/L — ABNORMAL HIGH (ref 0.0–2.0)
Bicarbonate: 35 mmol/L — ABNORMAL HIGH (ref 20.0–28.0)
Bicarbonate: 35.4 mmol/L — ABNORMAL HIGH (ref 20.0–28.0)
Calcium, Ion: 1.11 mmol/L — ABNORMAL LOW (ref 1.15–1.40)
Calcium, Ion: 1.13 mmol/L — ABNORMAL LOW (ref 1.15–1.40)
HCT: 43 % (ref 36.0–46.0)
HCT: 43 % (ref 36.0–46.0)
Hemoglobin: 14.6 g/dL (ref 12.0–15.0)
Hemoglobin: 14.6 g/dL (ref 12.0–15.0)
O2 Saturation: 69 %
O2 Saturation: 69 %
Potassium: 3.9 mmol/L (ref 3.5–5.1)
Potassium: 4 mmol/L (ref 3.5–5.1)
Sodium: 141 mmol/L (ref 135–145)
Sodium: 141 mmol/L (ref 135–145)
TCO2: 37 mmol/L — ABNORMAL HIGH (ref 22–32)
TCO2: 37 mmol/L — ABNORMAL HIGH (ref 22–32)
pCO2, Ven: 56.9 mmHg (ref 44.0–60.0)
pCO2, Ven: 56.9 mmHg (ref 44.0–60.0)
pH, Ven: 7.396 (ref 7.250–7.430)
pH, Ven: 7.402 (ref 7.250–7.430)
pO2, Ven: 37 mmHg (ref 32.0–45.0)
pO2, Ven: 38 mmHg (ref 32.0–45.0)

## 2021-01-02 LAB — HCG, SERUM, QUALITATIVE: Preg, Serum: NEGATIVE

## 2021-01-02 LAB — GLUCOSE, CAPILLARY: Glucose-Capillary: 87 mg/dL (ref 70–99)

## 2021-01-02 LAB — POCT I-STAT 7, (LYTES, BLD GAS, ICA,H+H)
Acid-Base Excess: 9 mmol/L — ABNORMAL HIGH (ref 0.0–2.0)
Bicarbonate: 35.5 mmol/L — ABNORMAL HIGH (ref 20.0–28.0)
Calcium, Ion: 1.14 mmol/L — ABNORMAL LOW (ref 1.15–1.40)
HCT: 43 % (ref 36.0–46.0)
Hemoglobin: 14.6 g/dL (ref 12.0–15.0)
O2 Saturation: 93 %
Potassium: 4.2 mmol/L (ref 3.5–5.1)
Sodium: 141 mmol/L (ref 135–145)
TCO2: 37 mmol/L — ABNORMAL HIGH (ref 22–32)
pCO2 arterial: 53.4 mmHg — ABNORMAL HIGH (ref 32.0–48.0)
pH, Arterial: 7.431 (ref 7.350–7.450)
pO2, Arterial: 68 mmHg — ABNORMAL LOW (ref 83.0–108.0)

## 2021-01-02 SURGERY — RIGHT/LEFT HEART CATH AND CORONARY ANGIOGRAPHY
Anesthesia: LOCAL

## 2021-01-02 MED ORDER — IOHEXOL 350 MG/ML SOLN
INTRAVENOUS | Status: DC | PRN
  Administered 2021-01-02: 50 mL

## 2021-01-02 MED ORDER — VERAPAMIL HCL 2.5 MG/ML IV SOLN
INTRAVENOUS | Status: DC | PRN
  Administered 2021-01-02: 10 mL via INTRA_ARTERIAL

## 2021-01-02 MED ORDER — SODIUM CHLORIDE 0.9% FLUSH: 3.0000 mL | Freq: Two times a day (BID) | INTRAVENOUS | Status: DC

## 2021-01-02 MED ORDER — SODIUM CHLORIDE 0.9% FLUSH: 3.0000 mL | INTRAVENOUS | Status: DC | PRN

## 2021-01-02 MED ORDER — FENTANYL CITRATE (PF) 100 MCG/2ML IJ SOLN
INTRAMUSCULAR | Status: AC
  Filled 2021-01-02: qty 2

## 2021-01-02 MED ORDER — SODIUM CHLORIDE 0.9 % IV SOLN: 250.0000 mL | INTRAVENOUS | Status: DC | PRN

## 2021-01-02 MED ORDER — VERAPAMIL HCL 2.5 MG/ML IV SOLN
INTRAVENOUS | Status: AC
  Filled 2021-01-02: qty 2

## 2021-01-02 MED ORDER — HEPARIN SODIUM (PORCINE) 1000 UNIT/ML IJ SOLN
INTRAMUSCULAR | Status: DC | PRN
  Administered 2021-01-02: 5000 [IU] via INTRAVENOUS

## 2021-01-02 MED ORDER — SODIUM CHLORIDE 0.9 % WEIGHT BASED INFUSION: 1.0000 mL/kg/h | INTRAVENOUS | Status: DC

## 2021-01-02 MED ORDER — HEPARIN (PORCINE) IN NACL 1000-0.9 UT/500ML-% IV SOLN
INTRAVENOUS | Status: DC | PRN
  Administered 2021-01-02 (×2): 500 mL

## 2021-01-02 MED ORDER — SODIUM CHLORIDE 0.9 % WEIGHT BASED INFUSION: 3.0000 mL/kg/h | INTRAVENOUS | Status: DC

## 2021-01-02 MED ORDER — FENTANYL CITRATE (PF) 100 MCG/2ML IJ SOLN
INTRAMUSCULAR | Status: DC | PRN
  Administered 2021-01-02: 50 ug via INTRAVENOUS

## 2021-01-02 MED ORDER — HEPARIN SODIUM (PORCINE) 1000 UNIT/ML IJ SOLN
INTRAMUSCULAR | Status: AC
  Filled 2021-01-02: qty 1

## 2021-01-02 MED ORDER — MIDAZOLAM HCL 2 MG/2ML IJ SOLN
INTRAMUSCULAR | Status: DC | PRN
  Administered 2021-01-02: 1 mg via INTRAVENOUS

## 2021-01-02 MED ORDER — HEPARIN (PORCINE) IN NACL 1000-0.9 UT/500ML-% IV SOLN
INTRAVENOUS | Status: AC
  Filled 2021-01-02: qty 1000

## 2021-01-02 MED ORDER — ACETAMINOPHEN 325 MG PO TABS
650.0000 mg | ORAL_TABLET | ORAL | Status: DC | PRN
Start: 2021-01-02 — End: 2021-01-02

## 2021-01-02 MED ORDER — ASPIRIN 81 MG PO CHEW
81.0000 mg | CHEWABLE_TABLET | ORAL | Status: AC
  Administered 2021-01-02: 81 mg via ORAL
  Filled 2021-01-02: qty 1

## 2021-01-02 MED ORDER — ONDANSETRON HCL 4 MG/2ML IJ SOLN: 4.0000 mg | Freq: Four times a day (QID) | INTRAMUSCULAR | Status: DC | PRN

## 2021-01-02 MED ORDER — LIDOCAINE HCL (PF) 1 % IJ SOLN
INTRAMUSCULAR | Status: AC
  Filled 2021-01-02: qty 30

## 2021-01-02 MED ORDER — LIDOCAINE HCL (PF) 1 % IJ SOLN
INTRAMUSCULAR | Status: DC | PRN
  Administered 2021-01-02 (×2): 2 mL via INTRADERMAL

## 2021-01-02 MED ORDER — MIDAZOLAM HCL 2 MG/2ML IJ SOLN
INTRAMUSCULAR | Status: AC
  Filled 2021-01-02: qty 2

## 2021-01-02 SURGICAL SUPPLY — 11 items
CATH BALLN WEDGE 5F 110CM (CATHETERS) ×1 IMPLANT
CATH OPTITORQUE TIG 4.0 5F (CATHETERS) ×1 IMPLANT
DEVICE RAD COMP TR BAND LRG (VASCULAR PRODUCTS) ×1 IMPLANT
GLIDESHEATH SLEND A-KIT 6F 22G (SHEATH) ×1 IMPLANT
KIT HEART LEFT (KITS) ×2 IMPLANT
PACK CARDIAC CATHETERIZATION (CUSTOM PROCEDURE TRAY) ×2 IMPLANT
SHEATH GLIDE SLENDER 4/5FR (SHEATH) ×1 IMPLANT
SHEATH PROBE COVER 6X72 (BAG) ×1 IMPLANT
TRANSDUCER W/STOPCOCK (MISCELLANEOUS) ×2 IMPLANT
TUBING CIL FLEX 10 FLL-RA (TUBING) ×2 IMPLANT
WIRE HI TORQ VERSACORE-J 145CM (WIRE) ×1 IMPLANT

## 2021-01-02 NOTE — Interval H&P Note (Signed)
History and Physical Interval Note:  01/02/2021 1:00 PM  Abigail Vega  has presented today for surgery, with the diagnosis of shortness of breath.  The various methods of treatment have been discussed with the patient and family. After consideration of risks, benefits and other options for treatment, the patient has consented to  Procedure(s): RIGHT/LEFT HEART CATH AND CORONARY ANGIOGRAPHY (N/A) and possible angioplasty as a surgical intervention.  The patient's history has been reviewed, patient examined, no change in status, stable for surgery.  I have reviewed the patient's chart and labs.  Questions were answered to the patient's satisfaction.   Cath Lab Visit (complete for each Cath Lab visit)  Clinical Evaluation Leading to the Procedure:   ACS: No.  Non-ACS:    Anginal Classification: CCS III  Anti-ischemic medical therapy: No Therapy  Non-Invasive Test Results: No non-invasive testing performed  Prior CABG: No previous CABG   Adrian Prows

## 2021-01-02 NOTE — Research (Signed)
IDENTIFY Informed Consent                  Subject Name: Abigail Vega   Subject met inclusion and exclusion criteria.  The informed consent form, study requirements and expectations were reviewed with the subject and questions and concerns were addressed prior to the signing of the consent form.  The subject verbalized understanding of the trial requirements.  The subject agreed to participate in the IDENTIFY trial and signed the informed consent at 12:11PM on 01/02/21.  The informed consent was obtained prior to performance of any protocol-specific procedures for the subject.  A copy of the signed informed consent was given to the subject and a copy was placed in the subject's medical record.    Ledon Snare , Research Assistant

## 2021-01-03 ENCOUNTER — Encounter: Payer: BC Managed Care – PPO | Admitting: Internal Medicine

## 2021-01-03 ENCOUNTER — Telehealth: Payer: Self-pay | Admitting: Internal Medicine

## 2021-01-03 NOTE — Telephone Encounter (Signed)
Call made to patient, confirmed DOB. Made aware this will be her best option. I made her aware the wait is probably much longer with other companies or if we switch DME companies. Voiced understanding.   Nothing further needed at this time.

## 2021-01-04 ENCOUNTER — Ambulatory Visit: Payer: BC Managed Care – PPO

## 2021-01-04 ENCOUNTER — Other Ambulatory Visit: Payer: Self-pay

## 2021-01-04 DIAGNOSIS — R0602 Shortness of breath: Secondary | ICD-10-CM

## 2021-01-12 ENCOUNTER — Ambulatory Visit: Payer: BC Managed Care – PPO | Admitting: Cardiology

## 2021-01-14 ENCOUNTER — Ambulatory Visit (HOSPITAL_BASED_OUTPATIENT_CLINIC_OR_DEPARTMENT_OTHER): Payer: BC Managed Care – PPO | Attending: Internal Medicine | Admitting: Internal Medicine

## 2021-01-14 ENCOUNTER — Other Ambulatory Visit: Payer: Self-pay

## 2021-01-14 DIAGNOSIS — G4710 Hypersomnia, unspecified: Secondary | ICD-10-CM | POA: Diagnosis present

## 2021-01-14 DIAGNOSIS — G4733 Obstructive sleep apnea (adult) (pediatric): Secondary | ICD-10-CM | POA: Diagnosis not present

## 2021-01-14 DIAGNOSIS — R0683 Snoring: Secondary | ICD-10-CM

## 2021-01-15 NOTE — Procedures (Signed)
   NAME: Abigail Vega DATE OF BIRTH:  02/13/1976 MEDICAL RECORD NUMBER [id number]  LOCATION: [facility]  PHYSICIAN: Marius Ditch  DATE OF STUDY: 01/14/2021  SLEEP STUDY TYPE: Nocturnal Polysomnogram               REFERRING PHYSICIAN: Marius Ditch, MD  EPWORTH SLEEPINESS SCORE:  12 HEIGHT: _0  (172.7 cm)  WEIGHT: (!) 395 lb (179.2 kg)    Body mass index is 60.06 kg/m.  NECK SIZE: 21 in.  CLINICAL INFORMATION The patient was referred to the sleep center for evaluation of nocturnal hypoxemia and hypoxemia during colonoscopy, snoring, excessive daytime sleepiness and elevated HCO3.   MEDICATIONS No sleep medicine administered.Marland Kitchen  SLEEP STUDY TECHNIQUE A multi-channel overnight Polysomnography study was performed. The channels recorded and monitored were central and occipital EEG, electrooculogram (EOG), submentalis EMG (chin), nasal and oral airflow, thoracic and abdominal wall motion, anterior tibialis EMG, snore microphone, electrocardiogram, and a pulse oximetry.  TECHNICAL COMMENTS Comments added by Technician: Patient talked in his/her sleep. Comments added by Scorer: N/A  SLEEP ARCHITECTURE The study was initiated at 11:01:17 PM and terminated at 5:09:42 AM. The total recorded time was 368.4 minutes. EEG confirmed total sleep time was 308.1 minutes yielding a sleep efficiency of 83.6%%. Sleep onset after lights out was 22.4 minutes with a REM latency of 81.0 minutes. The patient spent 0.2%% of the night in stage N1 sleep, 72.7%% in stage N2 sleep, 0.0%% in stage N3 and 27.1% in REM. Wake after sleep onset (WASO) was 38.0 minutes. The Arousal Index was 79.7/hour.  RESPIRATORY PARAMETERS There were a total of 447 respiratory disturbances out of which 313 were apneas ( 313 obstructive, 0 mixed, 0 central) and 134 hypopneas. The apnea/hypopnea index (AHI) was 87.1 events/hour. The central sleep apnea index was 0 events/hour. The REM AHI was 74.7  events/hour and NREM AHI was 91.9 events/hour. The supine AHI was 102.6 events/hour and the non supine AHI was 85 events/hour. She was near supine during 11.58% of sleep. Respiratory disturbance index was the same as AHI. Respiratory disturbances were associated with oxygen desaturation down to a nadir of 51.0% during sleep. The mean oxygen saturation during the study was 68.7%. Oxygen was below 89% for 287 minutes of the night  LEG MOVEMENT DATA The total leg movements were 0 with a resulting leg movement index of 0.0/hr . Associated arousal with leg movement index was 0.0/hr.  CARDIAC DATA The underlying cardiac rhythm was most consistent with sinus rhythm. Mean heart rate during sleep was 89.3 bpm. Additional rhythm abnormalities include None.  IMPRESSIONS - Severe Obstructive Sleep apnea(OSA) - Severe Oxygen Desaturation - No significant periodic leg movements(PLMs) during sleep.   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  Marius Ditch Sleep specialist, [place] [employer]   ELECTRONICALLY SIGNED ON:  01/15/2021, 8:19 PM [place] PH: [phone]   FX: [phone] [place]

## 2021-01-16 ENCOUNTER — Encounter (HOSPITAL_BASED_OUTPATIENT_CLINIC_OR_DEPARTMENT_OTHER): Payer: Self-pay

## 2021-01-16 ENCOUNTER — Institutional Professional Consult (permissible substitution): Payer: BC Managed Care – PPO | Admitting: Pulmonary Disease

## 2021-01-16 DIAGNOSIS — G4733 Obstructive sleep apnea (adult) (pediatric): Secondary | ICD-10-CM

## 2021-01-17 ENCOUNTER — Other Ambulatory Visit: Payer: Self-pay

## 2021-01-17 ENCOUNTER — Ambulatory Visit (HOSPITAL_BASED_OUTPATIENT_CLINIC_OR_DEPARTMENT_OTHER): Payer: BC Managed Care – PPO | Attending: Internal Medicine | Admitting: Internal Medicine

## 2021-01-17 DIAGNOSIS — G4733 Obstructive sleep apnea (adult) (pediatric): Secondary | ICD-10-CM

## 2021-01-18 ENCOUNTER — Encounter: Payer: Self-pay | Admitting: Cardiology

## 2021-01-18 ENCOUNTER — Ambulatory Visit: Payer: BC Managed Care – PPO | Admitting: Cardiology

## 2021-01-18 VITALS — BP 152/75 | HR 90 | Resp 16 | Ht 68.0 in | Wt >= 6400 oz

## 2021-01-18 DIAGNOSIS — E119 Type 2 diabetes mellitus without complications: Secondary | ICD-10-CM

## 2021-01-18 DIAGNOSIS — G4733 Obstructive sleep apnea (adult) (pediatric): Secondary | ICD-10-CM

## 2021-01-18 DIAGNOSIS — I2723 Pulmonary hypertension due to lung diseases and hypoxia: Secondary | ICD-10-CM

## 2021-01-18 DIAGNOSIS — Z9981 Dependence on supplemental oxygen: Secondary | ICD-10-CM

## 2021-01-18 DIAGNOSIS — R0602 Shortness of breath: Secondary | ICD-10-CM

## 2021-01-18 MED ORDER — BUMETANIDE 1 MG PO TABS: 1.0000 mg | ORAL_TABLET | ORAL | 0 refills | Status: DC

## 2021-01-18 NOTE — Progress Notes (Signed)
Date:  01/18/2021   ID:  Abigail Vega, DOB 1975-08-03, MRN [id number]  PCP:  Marda Stalker, PA-C  Cardiologist:  Rex Kras, DO, [facility]  (established care 12/18/2020)  Date: 01/18/21 Last Office Visit: 12/18/2020  Chief Complaint  Patient presents with   Post heart catheterization   Shortness of Breath   Follow-up   Results    HPI  Abigail Vega is a 45 y.o. 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  She is referred to the office at the request of Marda Stalker, PA-C for evaluation of dyspnea,.  After routine colonoscopy she was found to be hypoxic requiring oxygen. She also had a CT of the chest PE protocol was noted no evidence of pulmonary embolism but the study was limited due to body habitus and breathing motion artifact.  Diffuse groundglass opacities throughout the lungs with interlobar septal thickening, consistent with pulmonary edema and/or infection along with evidence of cardiomegaly and prominent mediastinal lymph nodes, likely reactive.  She has been evaluated by pulmonary medicine and the working differential diagnosis included chronic/incidental hypoxia, obesity hypoventilatory syndrome, pulmonary hypertension.  In the past her proBNP was as low as 28.  During prior office visits patient has mentioned episodes of shortness of breath that has been chronic for more than a year but symptoms started to be more progressive after recent colonoscopy.  She was also complaining of chest pain though mostly noncardiac given her other comorbid conditions ischemic work-up is warranted.  Since last office visit patient underwent left and right heart  catheterizations results reviewed with her and findings noted below.  Clinically patient remains to be short of breath without any change in intensity, frequency, or duration.  She remains on 3 L nasal cannula due to comfort.  She has been evaluated for sleep apnea and is noted to have severe OSA with severe hypoxic desaturation.  Patient states that she is currently in the process of getting a BiPAP plus nocturnal oxygen support.  FUNCTIONAL STATUS: No structured exercise program or daily routine.  ALLERGIES: Allergies  Allergen Reactions   Other Cough    Tree, molds, cat, dogs , mites    MEDICATION LIST PRIOR TO VISIT: Current Meds  Medication Sig   albuterol (VENTOLIN HFA) 108 (90 Base) MCG/ACT inhaler Inhale 2 puffs into the lungs every 6 (six) hours as needed for wheezing or shortness of breath.   bumetanide (BUMEX) 1 MG tablet Take 1 tablet (1 mg total) by mouth every morning.   buPROPion (WELLBUTRIN XL) 150 MG 24 hr tablet Take 150 mg by mouth daily.   cetirizine (ZYRTEC) 10 MG tablet Take 10 mg by mouth daily.   EPINEPHrine 0.3 mg/0.3 mL IJ SOAJ injection Inject 0.3 mg into the muscle as needed for anaphylaxis.   ibuprofen (ADVIL) 200 MG tablet Take 600 mg by mouth every 8 (eight) hours as needed for moderate pain.   metFORMIN (GLUCOPHAGE) 500 MG tablet Take 1,000 mg by mouth daily with breakfast.   montelukast (SINGULAIR) 10 MG tablet Take 10 mg by mouth every morning.   norethindrone-ethinyl estradiol-iron (LOESTRIN FE) 1.5-30 MG-MCG tablet Take 1 tablet by mouth daily.   rosuvastatin (CRESTOR) 10 MG tablet Take 10 mg by mouth daily.  sertraline (ZOLOFT) 50 MG tablet Take 50 mg by mouth daily.   SUMAtriptan (IMITREX) 100 MG tablet Take 100 mg by mouth every 2 (two) hours as needed for migraine. May repeat in 2 hours if headache persists or recurs.   UNABLE TO FIND See admin instructions. Med Name: allergy injection every 3 weeks     PAST MEDICAL HISTORY: Past Medical  History:  Diagnosis Date   Asthma    Depression    Diabetes mellitus without complication ([place])    Seasonal allergies     PAST SURGICAL HISTORY: Past Surgical History:  Procedure Laterality Date   COLONOSCOPY WITH PROPOFOL N/A 12/01/2020   Procedure: COLONOSCOPY WITH PROPOFOL;  Surgeon: Clarene Essex, MD;  Location: [facility];  Service: Endoscopy;  Laterality: N/A;   NASAL SEPTOPLASTY W/ TURBINOPLASTY  1991   RIGHT/LEFT HEART CATH AND CORONARY ANGIOGRAPHY N/A 01/02/2021   Procedure: RIGHT/LEFT HEART CATH AND CORONARY ANGIOGRAPHY;  Surgeon: Adrian Prows, MD;  Location: [facility];  Service: Cardiovascular;  Laterality: N/A;   TONSILECTOMY, ADENOIDECTOMY, BILATERAL MYRINGOTOMY AND TUBES  1981   TYMPANOPLASTY  1986    FAMILY HISTORY: Mom and dad are both alive.  No significant cardiac history.  Patient denies any premature CAD or sudden cardiac death in the family.  SOCIAL HISTORY:  The patient  reports that she has never smoked. She has never used smokeless tobacco. She reports that she does not drink alcohol and does not use drugs.  REVIEW OF SYSTEMS: Review of Systems  Constitutional: Negative for chills and fever.  HENT:  Negative for hoarse voice and nosebleeds.   Eyes:  Negative for discharge, double vision and pain.  Cardiovascular:  Positive for dyspnea on exertion. Negative for chest pain, claudication, leg swelling, near-syncope, orthopnea, palpitations, paroxysmal nocturnal dyspnea and syncope.  Respiratory:  Positive for shortness of breath. Negative for hemoptysis.   Musculoskeletal:  Negative for muscle cramps and myalgias.  Gastrointestinal:  Negative for abdominal pain, constipation, diarrhea, hematemesis, hematochezia, melena, nausea and vomiting.  Neurological:  Negative for dizziness and light-headedness.   PHYSICAL EXAM: Vitals with BMI 01/18/2021 01/17/2021 01/14/2021  Height _0  _1  _2   Weight 411 lbs 395 lbs 395 lbs  BMI 62.51 24.40 10.27  Systolic  253 - -  Diastolic 75 - -  Pulse 90 - -    CONSTITUTIONAL: Appears older than stated age, nasal cannula oxygen, hemodynamically stable, no acute distress,   SKIN: Skin is warm and dry. No rash noted. No cyanosis. No pallor. No jaundice HEAD: Normocephalic and atraumatic.  EYES: No scleral icterus MOUTH/THROAT: Moist oral membranes.  NECK: JVD unable to evaluate due to neck stature and adipose tissue. No thyromegaly noted. No carotid bruits  LYMPHATIC: No visible cervical adenopathy.  CHEST Normal respiratory effort. No intercostal retractions  LUNGS: Decreased breath sounds bilaterally.  No stridor. No wheezes. No rales.  CARDIOVASCULAR: Regular, positive S1-S2, no murmurs rubs or gallops appreciated mostly secondary to tachycardia. ABDOMINAL: Obese, soft, nontender, nondistended, positive bowel sounds all 4 quadrants. No apparent ascites.  EXTREMITIES: Trace bilateral peripheral edema  HEMATOLOGIC: No significant bruising NEUROLOGIC: Oriented to person, place, and time. Nonfocal. Normal muscle tone.  PSYCHIATRIC: Normal mood and affect. Normal behavior. Cooperative  CARDIAC DATABASE: EKG: 12/18/2020: Normal sinus rhythm, 93 bpm, without underlying ischemia or injury pattern.  Echocardiogram: 01/04/2021:  Left ventricle cavity is normal in size and wall thickness. Normal global wall motion. Normal LV systolic function with EF 63%. Normal diastolic filling pattern.  Mild (  Grade I) mitral regurgitation.  Trace tricuspid regurgitation.  IVC is not seen to estimate PASP.    Stress Testing: No results found for this or any previous visit from the past 1095 days.  Heart Catheterization: Right and left heart catheterization 01/02/2021: LV 120/11, EDP 24 mmHg.  Ao 114/74, mean 89 mmHg.  No pressure gradient across the aortic valve.  Markedly elevated EDP.  LVEF 55 to 60%. Left main: Large vessel, normal. LAD: Large vessel.  Gives origin to 2 large D1 and D2.  Smooth and normal. Ramus  intermediate: Moderate to large vessel, normal. CX: Gives origin to large OM1 and OM 2 and continues in a small AV groove branch.  Smooth and normal. RCA: Dominant.  Smooth and normal.  RA: 16/14, mean 13 mmHg.  RV 44/5, EDP 15 mmHg.  PA 46/16, mean 31 mmHg.  PA saturation 69%.  PW 22/21, mean 19 mmHg.  Impression: Normal coronary arteries.  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  Recommendation: Weight loss, gentle diuresis.   LABORATORY DATA: CBC Latest Ref Rng & Units 01/02/2021 01/02/2021 01/02/2021  WBC 3.4 - 10.8 x10E3/uL - - -  Hemoglobin 12.0 - 15.0 g/dL 14.6 14.6 14.6  Hematocrit 36.0 - 46.0 % 43.0 43.0 43.0  Platelets 150.0 - 400.0 K/uL - - -    CMP Latest Ref Rng & Units 01/02/2021 01/02/2021 01/02/2021  Glucose 65 - 99 mg/dL - - -  BUN 6 - 24 mg/dL - - -  Creatinine 0.57 - 1.00 mg/dL - - -  Sodium 135 - 145 mmol/L 141 141 141  Potassium 3.5 - 5.1 mmol/L 4.2 3.9 4.0  Chloride 96 - 106 mmol/L - - -  CO2 20 - 29 mmol/L - - -  Calcium 8.7 - 10.2 mg/dL - - -  Total Protein 6.0 - 8.3 g/dL - - -  Total Bilirubin 0.2 - 1.2 mg/dL - - -  Alkaline Phos 39 - 117 U/L - - -  AST 0 - 37 U/L - - -  ALT 0 - 35 U/L - - -    Lipid Panel  No results found for: CHOL, TRIG, HDL, CHOLHDL, VLDL, LDLCALC, LDLDIRECT, LABVLDL  No components found for: NTPROBNP Recent Labs    12/14/20 1030  PROBNP 28.0   No results for input(s): TSH in the last 8760 hours.  BMP Recent Labs    12/01/20 1454 12/02/20 0312 12/14/20 1030 12/26/20 1629 01/02/21 1335 01/02/21 1343  NA 137 139 140 145* 141  141 141  K 4.0 4.1 4.3 4.3 3.9  4.0 4.2  CL 101 101 102 101  --   --   CO2 _0 --   --   GLUCOSE 84 104* 83 67  --   --   BUN _1 --   --   CREATININE 0.88 0.85 0.77 0.70  --   --   CALCIUM 8.6* 9.1 9.3 8.9  --   --   GFRNONAA >60 >60  --   --   --   --     HEMOGLOBIN A1C Lab Results  Component Value Date   HGBA1C 6.5 (H) 12/02/2020   MPG  139.85 12/02/2020   External Labs: Collected: 08/03/2020 Creatinine 0.79 mg/dL. eGFR: 78 mL/min per 1.73 m Potassium 4.4 AST 10, ALT 11, alkaline phosphatase 82 (all within normal limits) Total cholesterol 182, triglycerides 125, HDL 43, LDL 116 Hemoglobin A1c: 6.1  IMPRESSION:  ICD-10-CM   1. WHO group 3 pulmonary arterial hypertension (HCC)  I27.23 bumetanide (BUMEX) 1 MG tablet    Basic metabolic panel    Magnesium    2. Shortness of breath  R06.02 bumetanide (BUMEX) 1 MG tablet    Basic metabolic panel    Magnesium    3. Supplemental oxygen dependent  Z99.81     4. Sleep apnea, obstructive  G47.33     5. Diabetes mellitus without complication (HCC)  O16.0     6. Class 3 severe obesity due to excess calories with serious comorbidity and body mass index (BMI) of 60.0 to 69.9 in adult [facility])  E66.01    Z68.44        RECOMMENDATIONS: Abigail Vega is a 45 y.o. female whose past medical history and cardiac risk factors include: migraines, depression, type 2 diabetes, Obesity due to excess calories, history of pneumonia since childhood.  Pulmonary hypertension: Mean PA pressure 31 mmHg on the most recent right heart catheterization. Most likely secondary to [facility] group 3 PH and elevated LVEDP. Patient was given a trial of Lasix during the initial office consultation and she states no significant change in symptoms. Will start Bumex 1 mg p.o. daily. Labs in 1 week to evaluate kidney function and electrolytes.  Shortness of breath: From a cardiovascular standpoint we will focus on diuresis for now.  If she feels better after the initiation of Bumex and may consider a repeat right heart catheterization to further evaluate pulmonary pressures. Currently following with pulmonary medicine. Patient in the process of being treated for severe obstructive sleep apnea and nocturnal hypoxia I have asked her to discuss possibly seeing pulmonary medicine and even cardiovascular medicine  at a tertiary care center given the progressiveness of her dyspnea over the last several months.  Patient states that she will discuss it further with primary care provider and pulmonary medicine.  Non-insulin-dependent diabetes mellitus type 2:  Currently on antiglycemic agents.  Last hemoglobin A1c 6.1 Currently managed by primary care provider.    FINAL MEDICATION LIST END OF ENCOUNTER: Meds ordered this encounter  Medications   bumetanide (BUMEX) 1 MG tablet    Sig: Take 1 tablet (1 mg total) by mouth every morning.    Dispense:  30 tablet    Refill:  0     There are no discontinued medications.   Current Outpatient Medications:    albuterol (VENTOLIN HFA) 108 (90 Base) MCG/ACT inhaler, Inhale 2 puffs into the lungs every 6 (six) hours as needed for wheezing or shortness of breath., Disp: , Rfl:    bumetanide (BUMEX) 1 MG tablet, Take 1 tablet (1 mg total) by mouth every morning., Disp: 30 tablet, Rfl: 0   buPROPion (WELLBUTRIN XL) 150 MG 24 hr tablet, Take 150 mg by mouth daily., Disp: , Rfl:    cetirizine (ZYRTEC) 10 MG tablet, Take 10 mg by mouth daily., Disp: , Rfl:    EPINEPHrine 0.3 mg/0.3 mL IJ SOAJ injection, Inject 0.3 mg into the muscle as needed for anaphylaxis., Disp: , Rfl:    ibuprofen (ADVIL) 200 MG tablet, Take 600 mg by mouth every 8 (eight) hours as needed for moderate pain., Disp: , Rfl:    metFORMIN (GLUCOPHAGE) 500 MG tablet, Take 1,000 mg by mouth daily with breakfast., Disp: , Rfl:    montelukast (SINGULAIR) 10 MG tablet, Take 10 mg by mouth every morning., Disp: , Rfl:    norethindrone-ethinyl estradiol-iron (LOESTRIN FE) 1.5-30 MG-MCG tablet, Take 1 tablet by  mouth daily., Disp: , Rfl:    rosuvastatin (CRESTOR) 10 MG tablet, Take 10 mg by mouth daily., Disp: , Rfl:    sertraline (ZOLOFT) 50 MG tablet, Take 50 mg by mouth daily., Disp: , Rfl:    SUMAtriptan (IMITREX) 100 MG tablet, Take 100 mg by mouth every 2 (two) hours as needed for migraine. May repeat in  2 hours if headache persists or recurs., Disp: , Rfl:    UNABLE TO FIND, See admin instructions. Med Name: allergy injection every 3 weeks, Disp: , Rfl:   Orders Placed This Encounter  Procedures   Basic metabolic panel   Magnesium    There are no Patient Instructions on file for this visit.   --Continue cardiac medications as reconciled in final medication list. --Return in 3 months (on 04/20/2021) for Dyspnea. Or sooner if needed. --Continue follow-up with your primary care physician regarding the management of your other chronic comorbid conditions.  Patient's questions and concerns were addressed to her satisfaction. She voices understanding of the instructions provided during this encounter.   This note was created using a voice recognition software as a result there may be grammatical errors inadvertently enclosed that do not reflect the nature of this encounter. Every attempt is made to correct such errors.  Rex Kras, [place], [facility]  Pager: [phone] Office: [phone]

## 2021-01-23 NOTE — Procedures (Signed)
NAME: Abigail Vega DATE OF BIRTH:  May 17, 1976 MEDICAL RECORD NUMBER [id number]  LOCATION: [facility]  PHYSICIAN: Marius Ditch  DATE OF STUDY: 01/17/2021  SLEEP STUDY TYPE: Positive Airway Pressure Titration               REFERRING PHYSICIAN: Marda Stalker, PA-C  EPWORTH SLEEPINESS SCORE:   HEIGHT: _0  (172.7 cm)  WEIGHT: (!) 395 lb (179.2 kg)    Body mass index is 60.06 kg/m.  NECK SIZE: 21 in.  CLINICAL INFORMATION The patient was referred to the sleep center for PAP titration with or without supplemental oxygen. The patient has severe OSA with a lowest O2 sat of 51%. 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 This is based on her weight, her serum bicarbonate of 33, daytime hypoxemia, and pulmonary hypertension.  MEDICATIONS Patient self administered medications include: none. No sleep medicine administered.Marland Kitchen  SLEEP STUDY TECHNIQUE The patient underwent an attended overnight polysomnography titration to assess the effects of cpap therapy. The following variables were monitored: EEG(C4-A1, C3-A2, O1-A2, O2-A1), EOG, submental and leg EMG, ECG, oxyhemoglobin saturation by pulse oximetry, thoracic and abdominal respiratory effort belts, nasal/oral airflow by pressure sensor, body position sensor and snoring sensor. CPAP pressure was titrated to eliminate apneas, hypopneas and oxygen desaturation.  TECHNICAL COMMENTS Comments added by Technician: No restroom visted. O2 initiated due to low sats. Comments added by Scorer: N/A  SLEEP ARCHITECTURE The study was initiated at 9:47:40 PM and terminated at 5:01:38 AM. Total recorded time was 434 minutes. EEG confirmed total sleep time was 416.5 minutes yielding a sleep efficiency of 96.0%. Sleep onset after lights out was 4.7 minutes with a REM latency of 88.0 minutes. The patient spent 1.20% of the night in stage N1 sleep, 43.34% in  stage N2 sleep, 2.28% in stage N3 and 53.2% in REM. This is consistent with REM rebound. The Arousal Index was 18.4/hour.   RESPIRATORY PARAMETERS The overall AHI was 22.0 per hour, and the RDI was 23.0 events/hour with a central apnea index of 0per hour. CPAP was started at 7 cm H2O. It was progressively increased and changed to BPAP due to high pressure requirements, persisting events, and persisting severe hypoxemia. She required supplemental oxygen at 3 LPM as 1 LPM and 2 LPM did not treat her hypoxemia adequately. The most appropriate setting of PAP was IPAP/EPAP 29/22 cm H2O with 3 LPM of oxygen. At this setting, the sleep efficiency was 100%, the patient was supine for 100%, the AHI was 0 events per hour, the RDI was 0 events/hour (with 0 central events), the arousal index was 0 per hour, and the oxygen nadir was 85.0%. Oxygen saturation was below 88% for 3% of the measured time at optimal pressure with 3 LPM.   The patient reported that her sleep during this study was better than her usual sleep at home.   LEG MOVEMENT DATA The total leg movements were with a resulting leg movement index of 0/hr. Associated arousal with leg movement index was 0.0/hr.  CARDIAC DATA The underlying cardiac rhythm was most consistent with sinus rhythm. Mean heart rate during sleep was 83 bpm. Additional rhythm abnormalities include None.  IMPRESSIONS - Severe Obstructive Sleep Apnea (OSA). Optimal pressure and adequate oxygenation attained with BPAP 29/22 and 3 LPM oxygen - No significant periodic leg movements(PLMs) during sleep.  - Evidence of REM rebound  DIAGNOSIS - Obstructive Sleep Apnea (G47.33) -  Obesity Hypoventilation Syndrome  RECOMMENDATIONS - Trial of BPAP therapy on 29/20 cm H2O with a Medium size Resmed Full Face Mask AirFit F20 mask and heated humidification and 3 LPM of oxygne. - Avoid alcohol, sedatives and other CNS depressants that may worsen sleep apnea and disrupt normal sleep  architecture. - Weight management is critical for this patient.  Marius Ditch Sleep specialist, [place] Board of Internal Medicine  ELECTRONICALLY SIGNED ON:  01/23/2021, 7:46 AM [place] PH: [phone]   FX: [phone] [place]

## 2021-01-24 ENCOUNTER — Encounter: Payer: BC Managed Care – PPO | Admitting: Internal Medicine

## 2021-01-24 NOTE — Progress Notes (Signed)
HPI F never smoker followed for Acute on Chronic Respiratory Failure with Hypoxia, OHS, Complicated by Asthma, Allergic Rhinitis, Morbid Obesity, DM2, Depression, WHO group 3PHTN,  Dr Maxwell Caul managing OSA- NPSG 01/15/21 AHI 86.3/ hr, Nocturnal Hypoxemia with mean O2 sat 70%. Dr Einar Gip did R/L Cath 8/9 Mild PHTN, mean 19, PA 46/16 O2 2 L sleep- [facility] POC 3L exertion>> [employer] for concentrator and POC Walk test 12/14/20- - RA- 89%/ HR 96/ min. Desat after 1/4 lap to 86%, required 2L O2 to hold at 90% on 2 L.  ================================================================================== 12/14/20- 52 yoF never smoker for evaluation of hypoxia during colonoscopy, courtesy of  Dr Lona Kettle.   Medical problem list includes Hypoxia, Asthma, Allergic Rhinitis, Morbid Obesity, DM2, Depression, Teaches second grade. Hosp 7/8-7/10/22 for hypoxia after normal colonoscopy. Desaturated on walk test to 79% and sent home on O2 2-3L/ [facility].   CT showed diffuse ground glass ( obesity, edema or viral).Given lasix x 2 days. Discharged on  O2, albuterol, prednisone taper. Advised to get sleep study. Allergy (Dr Margit Hanks on allergy vaccine.  Pending Cardiology consultation. Pending Sleep Apnea consultation with Dr Maxwell Caul.    Labs in hosp- BNP 36, D-dimer < 0.27, Covid neg, WBC 9.1, Hgb 15.2 Epworth score- Body weight today-295 lbs Covid vax- 3 Phizer Walk test 12/14/20- - RA- 89%/ HR 96/ min. Desat after 1/4 lap to 86%, required 2L O2 to hold at 90% on 2 L.      Came in today on O2 2L 95%. Gives hx recurrent pneumonias. She isn't sure if she has had pneumonia vaccine.  Mild Asthma- no hospitalizations.  No family or personal hx of heart disease or blood clots. Notices cough/ wheeze mostly with respiratory infections. No recent change in routine DOE with brisk walk/ stairs. Not aware of conceern about oxygenation with prior anesthesia for ENT procedures years ago.  CTa chest 12/01/20-  IMPRESSION: 1.  Examination for pulmonary embolism is significantly limited by body habitus and breath motion artifact throughout. Within this limitation, no evidence of pulmonary embolism through the segmental pulmonary arterial level. 2. Diffuse ground-glass opacity throughout the lungs with interlobular septal thickening, consistent with pulmonary edema and/or infection. 3. Cardiomegaly. 4. Prominent mediastinal lymph nodes, likely reactive.  01/25/21-  4 yoF never smoker followed for Acute on Chronic Respiratory Failure with edema/ pneumonia, Probable OHS, Complicated by Asthma, Allergic Rhinitis, Morbid Obesity, DM2, Depression, WHO group 3PHTN O2 2 L sleep- ROTECH POC 3L exertion ACT score today 21 Dr Maxwell Caul managing OSA- NPSG 01/15/21 AHI 86.3/ hr, Nocturnal Hypoxemia with mean O2 sat 70%.       BIPAP titration pending and expected to include documentation to continue nocturnal oxygen.  Dr Einar Gip did R/L Cath 8/9 Mild PHTN, mean 19, PA 46/16. His group follows cardiac. Allergist managing AR/ Asthma (singulair, ventolin hfa, zyrtec) Body weight today-410 lbs Covid vax-3 Phizer She is trying to work as Pharmacist, hospital. Portable tanks are prohibitively clumsy- needs POC.which can be provided by [employer] since [employer] doesn't have these.  She understands weight loss is key to turning this around. She is not interested in bariatric surgery now. Says she lost weight before with [employer] and wants to do that again.  CXR 12/14/20-  No active cardiopulmonary disease.   ROS-see HPI   + = positive Constitutional:    weight loss, night sweats, fevers, chills, +fatigue, lassitude. HEENT:    headaches, difficulty swallowing, tooth/dental problems, sore throat,       sneezing, itching, ear ache, nasal congestion,  post nasal drip, snoring CV:    chest pain, orthopnea, PND, swelling in lower extremities, anasarca,          dizziness, palpitations Resp:   +shortness of breath with exertion or at rest.                 productive cough,   non-productive cough, coughing up of blood.              change in color of mucus.  +wheezing.   Skin:    rash or lesions. GI:  No-   heartburn, indigestion, abdominal pain, nausea, vomiting, diarrhea,                 change in bowel habits, loss of appetite GU: dysuria, change in color of urine, no urgency or frequency.   flank pain. MS:   joint pain, stiffness, decreased range of motion, back pain. Neuro-     nothing unusual Psych:  change in mood or affect.  depression or anxiety.   memory loss.  OBJ- Physical Exam General- Alert, Oriented, Affect-appropriate, Distress- none acute, +morbidly obese. Mildly tachypneic on O2 2L during quiet conversation. Skin- rash-none, lesions- none, excoriation- none Lymphadenopathy- none Head- atraumatic            Eyes- Gross vision intact, PERRLA, conjunctivae and secretions clear            Ears- Hearing, canals-normal            Nose- Clear, no-Septal dev, mucus, polyps, erosion, perforation             Throat- Mallampati IV, mucosa clear , drainage- none, tonsils-absent Neck- flexible , trachea midline, no stridor , thyroid nl, carotid no bruit Chest - symmetrical excursion , unlabored           Heart/CV- RRR , no murmur , no gallop  , no rub, nl s1 s2                           - JVD- none , edema+3, stasis changes+,  varices- none           Lung- clear to P&A, wheeze- none, cough- none , dullness-none, rub- none           Chest wall-  Abd-  Br/ Gen/ Rectal- Not done, not indicated Extrem- cyanosis- none, clubbing, none, atrophy- none, strength- nl Neuro- grossly intact to observation

## 2021-01-25 ENCOUNTER — Ambulatory Visit (INDEPENDENT_AMBULATORY_CARE_PROVIDER_SITE_OTHER): Payer: BC Managed Care – PPO | Admitting: Internal Medicine

## 2021-01-25 ENCOUNTER — Institutional Professional Consult (permissible substitution): Payer: BC Managed Care – PPO | Admitting: Pulmonary Disease

## 2021-01-25 ENCOUNTER — Other Ambulatory Visit: Payer: Self-pay

## 2021-01-25 ENCOUNTER — Ambulatory Visit
Admission: RE | Admit: 2021-01-25 | Discharge: 2021-01-25 | Disposition: A | Discharge status: Home / Self Care | Payer: BC Managed Care – PPO | Source: Ambulatory Visit | Attending: Family Medicine | Admitting: Family Medicine

## 2021-01-25 ENCOUNTER — Encounter: Payer: Self-pay | Admitting: Internal Medicine

## 2021-01-25 ENCOUNTER — Telehealth: Payer: Self-pay | Admitting: Internal Medicine

## 2021-01-25 DIAGNOSIS — Z6841 Body Mass Index (BMI) 40.0 and over, adult: Secondary | ICD-10-CM

## 2021-01-25 DIAGNOSIS — J9611 Chronic respiratory failure with hypoxia: Secondary | ICD-10-CM

## 2021-01-25 DIAGNOSIS — G4733 Obstructive sleep apnea (adult) (pediatric): Secondary | ICD-10-CM

## 2021-01-25 DIAGNOSIS — R0602 Shortness of breath: Secondary | ICD-10-CM

## 2021-01-25 DIAGNOSIS — I2729 Other secondary pulmonary hypertension: Secondary | ICD-10-CM

## 2021-01-25 DIAGNOSIS — Z1231 Encounter for screening mammogram for malignant neoplasm of breast: Secondary | ICD-10-CM

## 2021-01-25 DIAGNOSIS — J4540 Moderate persistent asthma, uncomplicated: Secondary | ICD-10-CM

## 2021-01-25 DIAGNOSIS — I2723 Pulmonary hypertension due to lung diseases and hypoxia: Secondary | ICD-10-CM

## 2021-01-25 NOTE — Telephone Encounter (Signed)
Please keep an eye out on paperwork for this patient

## 2021-01-25 NOTE — Telephone Encounter (Signed)
Looked at Dr. Janee Morn mail downstairs but did not find anything on this patient. Will forward to [facility] to be on the lookout.

## 2021-01-25 NOTE — Patient Instructions (Signed)
You can discuss how Dr Nancee Liter team would want to manage O2 and your PAP machine. One thought would be to get the PAP machine from a local DME company [employer] or someone else), and get the oxygen from a company that can provide a Portable Oxygen Concentrator (POC) capable of 3-4 L demand flow as well as the home concentrator. Some people will do best working directly with [employer] as a POC provider, since DME companies can have trouble getting them.   As discussed, your weight is key here. Any program that supports weight loss for you is good. You will find programs analogous to Healthy Weight and Wellness at [facility] and [facility], as well as [facility]. Weight [employer] helped you before.   Please call if we can help

## 2021-01-26 ENCOUNTER — Telehealth: Payer: Self-pay | Admitting: Internal Medicine

## 2021-01-26 NOTE — Telephone Encounter (Signed)
-----  Message from Marius Ditch, MD sent at 01/25/2021  8:33 PM EDT ----- Regarding: Oxygen orders Hi [name],  I understand that Abigail Vega saw you today. I think you are managing her oxygen. I just wanted to be sure you were aware that her nocturnal oxygen need with BPAP was still 3LPM. I am unsure of what she is needing during the day, but please be sure her nocturnal oxygen order is for 3 LPM.   Also, we have arranged for [facility] to get her an S10 BPAP as early as this weekend or at the beginning of the week. I hope she noted that it would be helpful for her to be able to get her O2 and PAP from the same vendor and that she was getting her machine very soon.   Let me know if you have any questions from my side of things.  Thanks!  Clair Gulling

## 2021-01-26 NOTE — Telephone Encounter (Signed)
Hi Jim- thanks for your note. She plans to continue working and will need a POC for this. The tanks she is getting from [facility] are impractical.  Unfortunately, [employer] isn't supplying POCs. I put an order through yesterday to get her oxygen supplies directly through [employer], which now has a regional site. They can take over both her main concentrator and her portable. She will need to ask [employer] for a connection between her home [employer] concentrator and her BIPAP machine. -[name] ===View-only below this line=== ----- Message ----- From: Marius Ditch, MD Sent: 01/25/2021   8:37 PM EDT To: Deneise Lever, MD Subject: Oxygen orders

## 2021-01-26 NOTE — Telephone Encounter (Signed)
Dr Maxwell Caul has indicated she will be starting BIPAP through [employer] on his order, possibly as soon as this weekend.  We ordered yesterday (hopefully [facility] got this) to change O2 supplier from [employer] to [employer] (they have a regional site now). [employer] doesn't provide POCs, which she needs for work.  Please verify an order is in place to change O2 from [employer] to [employer]: 3 L/ min continuous during sleep- will need adapter to connect to her BIPAP. Also POC 3L pulse   Dx OSA, Chronic Hypoxic Respiratory Failure

## 2021-01-26 NOTE — Telephone Encounter (Signed)
Order has been placed to change O2 from [employer] to [facility]: 3 L/ min continuous during sleep- will need adapter to connect to her BIPAP. Also POC 3L pulse. The order that was placed yesterday was only sent to [place]. Nothing further needed at this time.

## 2021-01-26 NOTE — Addendum Note (Signed)
Addended by: Elby Beck R on: 01/26/2021 11:16 AM   Modules accepted: Orders

## 2021-01-30 ENCOUNTER — Encounter: Payer: Self-pay | Admitting: Internal Medicine

## 2021-01-30 DIAGNOSIS — I2729 Other secondary pulmonary hypertension: Secondary | ICD-10-CM | POA: Insufficient documentation

## 2021-01-30 NOTE — Assessment & Plan Note (Signed)
Dr Maxwell Caul has messaged that BIPAP titration confirms she will need O2 3L during sleep on BIPAP He will manage her BIPAP. Plan- We are changing O2 provider from [employer] to [facility] so that they can provide both concentrator at 3L and POC at 3-4L, needed so she can work.

## 2021-01-30 NOTE — Assessment & Plan Note (Signed)
Explained obesity hypoventilation and importance of weight loss. She has made decision to return to [employer].

## 2021-01-30 NOTE — Assessment & Plan Note (Signed)
Continue O2. Follow with cardiology

## 2021-01-31 ENCOUNTER — Other Ambulatory Visit: Payer: Self-pay | Admitting: Family Medicine

## 2021-01-31 DIAGNOSIS — R928 Other abnormal and inconclusive findings on diagnostic imaging of breast: Secondary | ICD-10-CM

## 2021-02-08 ENCOUNTER — Telehealth: Payer: Self-pay | Admitting: Internal Medicine

## 2021-02-08 NOTE — Telephone Encounter (Signed)
Abigail Vega, please advise if you have seen form on pt.

## 2021-02-08 NOTE — Telephone Encounter (Signed)
I have got this order about 5 times today I will give to him in the morning and get him to sign

## 2021-02-08 NOTE — Telephone Encounter (Signed)
Noted.

## 2021-02-10 ENCOUNTER — Other Ambulatory Visit: Payer: Self-pay

## 2021-02-10 ENCOUNTER — Ambulatory Visit
Admission: RE | Admit: 2021-02-10 | Discharge: 2021-02-10 | Disposition: A | Discharge status: Home / Self Care | Payer: BC Managed Care – PPO | Source: Ambulatory Visit | Attending: Family Medicine | Admitting: Family Medicine

## 2021-02-10 DIAGNOSIS — R928 Other abnormal and inconclusive findings on diagnostic imaging of breast: Secondary | ICD-10-CM

## 2021-02-12 NOTE — Telephone Encounter (Signed)
Calling  got signed CMN they are now needing actual results for walk test done on 7/21 they can be reached @ [phone] can fax to [phone].Hillery Hunter

## 2021-02-12 NOTE — Telephone Encounter (Signed)
Walk test has been sent to [facility] per their request.  Nothing further is needed.

## 2021-02-16 ENCOUNTER — Telehealth: Payer: Self-pay | Admitting: Internal Medicine

## 2021-02-16 NOTE — Telephone Encounter (Signed)
Called and spoke with patient. She stated that she received her POC yesterday from [employer] and is not sure how to use it. Per patient, the POC has 5 settings on it. She is currently using 3L of O2. I advised her it would be for her to contact [employer] directly in regards to tech support. She verbalized understanding.   Nothing further needed.

## 2021-02-16 NOTE — Telephone Encounter (Signed)
There's 5 different settings and she is usually on 3 liters of oxygen. She feels she's not getting enough oxygen. Has spoken w/[employer] and it's a pulse rate but breathes out from her mouth when out of breath.

## 2021-02-20 ENCOUNTER — Telehealth: Payer: Self-pay

## 2021-02-20 NOTE — Telephone Encounter (Signed)
Bumex is favored over the Lasix as Bumex is not protein bound so greater bioavailability.  As far as Aldactone that can be added if she has room with her BP as an adjunct therapy - will leave that your judgment as you have seen her most recent. Would recommend checking BMP a week later to evaluate her renal and potassium levels.   Thank-you  Dr. Terri Skains

## 2021-02-22 ENCOUNTER — Telehealth: Payer: Self-pay

## 2021-02-22 DIAGNOSIS — I2723 Pulmonary hypertension due to lung diseases and hypoxia: Secondary | ICD-10-CM

## 2021-02-22 DIAGNOSIS — R0602 Shortness of breath: Secondary | ICD-10-CM

## 2021-02-22 MED ORDER — BUMETANIDE 1 MG PO TABS: 1.0000 mg | ORAL_TABLET | ORAL | 0 refills | Status: DC

## 2021-02-22 NOTE — Telephone Encounter (Signed)
No answer left a vm

## 2021-02-23 LAB — BASIC METABOLIC PANEL
BUN/Creatinine Ratio: 14 (ref 9–23)
BUN: 12 mg/dL (ref 6–24)
CO2: 29 mmol/L (ref 20–29)
Calcium: 9.1 mg/dL (ref 8.7–10.2)
Chloride: 95 mmol/L — ABNORMAL LOW (ref 96–106)
Creatinine, Ser: 0.83 mg/dL (ref 0.57–1.00)
Glucose: 102 mg/dL — ABNORMAL HIGH (ref 70–99)
Potassium: 4.1 mmol/L (ref 3.5–5.2)
Sodium: 142 mmol/L (ref 134–144)
eGFR: 88 mL/min/{1.73_m2} (ref >59)

## 2021-02-23 LAB — MAGNESIUM: Magnesium: 1.9 mg/dL (ref 1.6–2.3)

## 2021-02-26 NOTE — Progress Notes (Signed)
Spoke to patient she is aware of results she said labs were done before starting on med

## 2021-03-08 NOTE — Telephone Encounter (Signed)
Error.

## 2021-03-12 ENCOUNTER — Telehealth: Payer: Self-pay | Admitting: Internal Medicine

## 2021-03-12 NOTE — Telephone Encounter (Signed)
I have called the pt and she is aware of letter per CY.  I have completed this and sent this to her via mychart.

## 2021-03-12 NOTE — Telephone Encounter (Signed)
Could you please provide her a note saying she has our permission to have massage while wearing her oxygen.     Thanks

## 2021-03-12 NOTE — Telephone Encounter (Signed)
Called and spoke with pt and she stated that she is needing a note stating that it is ok for her to get a massage.  She stated that she had 2 last week, but went to a different person yesterday for a massage and they refused her and told her that she would need a note from her doctor stating that it is ok for her to have one due to her being on oxygen.  CY please advise.    The facility told her that it is up to the individual person if they request a note from the doctor when a patient is on oxygen.  thanks

## 2021-03-19 ENCOUNTER — Other Ambulatory Visit: Payer: Self-pay | Admitting: Cardiology

## 2021-03-19 DIAGNOSIS — I2723 Pulmonary hypertension due to lung diseases and hypoxia: Secondary | ICD-10-CM

## 2021-03-19 DIAGNOSIS — R0602 Shortness of breath: Secondary | ICD-10-CM

## 2021-04-09 ENCOUNTER — Other Ambulatory Visit: Payer: Self-pay | Admitting: Cardiology

## 2021-04-09 DIAGNOSIS — R0602 Shortness of breath: Secondary | ICD-10-CM

## 2021-04-09 DIAGNOSIS — I2723 Pulmonary hypertension due to lung diseases and hypoxia: Secondary | ICD-10-CM

## 2021-04-23 ENCOUNTER — Ambulatory Visit: Payer: BC Managed Care – PPO | Admitting: Cardiology

## 2021-04-23 ENCOUNTER — Other Ambulatory Visit: Payer: Self-pay

## 2021-04-23 ENCOUNTER — Encounter: Payer: Self-pay | Admitting: Cardiology

## 2021-04-23 VITALS — BP 123/67 | HR 98 | Resp 16 | Ht 68.0 in | Wt 376.8 lb

## 2021-04-23 DIAGNOSIS — R0602 Shortness of breath: Secondary | ICD-10-CM

## 2021-04-23 DIAGNOSIS — G4733 Obstructive sleep apnea (adult) (pediatric): Secondary | ICD-10-CM

## 2021-04-23 DIAGNOSIS — E119 Type 2 diabetes mellitus without complications: Secondary | ICD-10-CM

## 2021-04-23 DIAGNOSIS — I2723 Pulmonary hypertension due to lung diseases and hypoxia: Secondary | ICD-10-CM

## 2021-04-23 DIAGNOSIS — Z6841 Body Mass Index (BMI) 40.0 and over, adult: Secondary | ICD-10-CM

## 2021-04-23 DIAGNOSIS — Z9981 Dependence on supplemental oxygen: Secondary | ICD-10-CM

## 2021-04-23 NOTE — Progress Notes (Signed)
Date:  04/23/2021   ID:  Abigail Vega, DOB 1976-05-16, MRN [id number]  PCP:  Marda Stalker, PA-C  Cardiologist:  Rex Kras, DO, [facility]  (established care 12/18/2020)  Date: 04/23/21 Last Office Visit: 01/18/2021  Chief Complaint  Patient presents with   Shortness of Breath   Follow-up    HPI  Abigail Vega is a 45 y.o. 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  She is referred to the office at the request of WMarda Stalker PA-C for evaluation of dyspnea,.  After recent colonoscopy she was found to be hypoxic requiring oxygen supplementation.  She underwent CT PE protocol which was negative for pulmonary embolism but limited due to body habitus and breathing motion artifact.  She was noted to have findings to suggest either pulmonary edema versus infection along with cardiomegaly and prominent mediastinal lymph nodes, likely reactive.  She has been evaluated by pulmonary medicine and then later referred to cardiology for further evaluation and management.  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  She is also been diagnosed with sleep apnea requiring BiPAP intervention with nocturnal oxygen support.  Since last office visit patient is doing well from a cardiovascular standpoint she denies any chest pain and shortness of breath has improved.  She is also implemented lifestyle changes and is working with weight loss providers and has lost 35 pounds since last office encounter in August 2022.  Patient is congratulated on her efforts and/continued success.  She  has an upcoming appointment with her sleep medicine provider.  Patient states that she needs to follow-up with pulmonary medicine as well to be reevaluated for the need of oxygen.  She is currently on 3 L nasal cannula.  Since last office encounter she is also been treated for RSV and bronchitis.  FUNCTIONAL STATUS: No structured exercise program or daily routine.  ALLERGIES: Allergies  Allergen Reactions   Other Cough    Tree, molds, cat, dogs , mites    MEDICATION LIST PRIOR TO VISIT: Current Meds  Medication Sig   albuterol (VENTOLIN HFA) 108 (90 Base) MCG/ACT inhaler Inhale 2 puffs into the lungs every 6 (six) hours as needed for wheezing or shortness of breath.   bumetanide (BUMEX) 1 MG tablet TAKE 1 TABLET BY MOUTH IN THE MORNING   buPROPion (WELLBUTRIN XL) 150 MG 24 hr tablet Take 150 mg by mouth in the morning and at bedtime.   cefdinir (OMNICEF) 300 MG capsule Take 300 mg by mouth 2 (two) times daily.   cetirizine (ZYRTEC) 10 MG tablet Take 10 mg by mouth daily.   EPINEPHrine 0.3 mg/0.3 mL IJ SOAJ injection Inject 0.3 mg into the muscle as needed for anaphylaxis.   ibuprofen (ADVIL) 200 MG tablet Take 600 mg by mouth every 8 (eight) hours as needed for moderate pain.   metFORMIN (GLUCOPHAGE) 500 MG tablet Take 1,000 mg by mouth daily with breakfast.   montelukast (SINGULAIR) 10 MG tablet Take 10 mg by mouth every morning.   MOUNJARO 7.5 MG/0.5ML Pen Inject into the skin once a week.   norethindrone-ethinyl estradiol-iron (LOESTRIN FE) 1.5-30 MG-MCG tablet Take 1 tablet  by mouth daily.   rosuvastatin (CRESTOR) 10 MG tablet Take 10 mg by mouth daily.   sertraline (ZOLOFT) 50 MG tablet Take 50 mg by mouth daily.   spironolactone (ALDACTONE) 25 MG tablet Take 25 mg by mouth daily.   SUMAtriptan (IMITREX) 100 MG tablet Take 100 mg by mouth every 2 (two) hours as needed for migraine. May repeat in 2 hours if headache persists or recurs.   UNABLE TO FIND See admin instructions. Med  Name: allergy injection every 3 weeks     PAST MEDICAL HISTORY: Past Medical History:  Diagnosis Date   Asthma    Depression    Diabetes mellitus without complication ([place])    Seasonal allergies     PAST SURGICAL HISTORY: Past Surgical History:  Procedure Laterality Date   COLONOSCOPY WITH PROPOFOL N/A 12/01/2020   Procedure: COLONOSCOPY WITH PROPOFOL;  Surgeon: Clarene Essex, MD;  Location: [facility];  Service: Endoscopy;  Laterality: N/A;   NASAL SEPTOPLASTY W/ TURBINOPLASTY  1991   RIGHT/LEFT HEART CATH AND CORONARY ANGIOGRAPHY N/A 01/02/2021   Procedure: RIGHT/LEFT HEART CATH AND CORONARY ANGIOGRAPHY;  Surgeon: Adrian Prows, MD;  Location: [place] [facility];  Service: Cardiovascular;  Laterality: N/A;   TONSILECTOMY, ADENOIDECTOMY, BILATERAL MYRINGOTOMY AND TUBES  1981   TYMPANOPLASTY  1986    FAMILY HISTORY: Mom and dad are both alive.  No significant cardiac history.  Patient denies any premature CAD or sudden cardiac death in the family.  SOCIAL HISTORY:  The patient  reports that she has never smoked. She has never used smokeless tobacco. She reports that she does not drink alcohol and does not use drugs.  REVIEW OF SYSTEMS: Review of Systems  Constitutional: Negative for chills and fever.  HENT:  Negative for hoarse voice and nosebleeds.   Eyes:  Negative for discharge, double vision and pain.  Cardiovascular:  Positive for dyspnea on exertion (improved). Negative for chest pain, claudication, leg swelling, near-syncope, orthopnea, palpitations, paroxysmal nocturnal dyspnea and syncope.  Respiratory:  Positive for shortness of breath (improved). Negative for hemoptysis.   Musculoskeletal:  Negative for muscle cramps and myalgias.  Gastrointestinal:  Negative for abdominal pain, constipation, diarrhea, hematemesis, hematochezia, melena, nausea and vomiting.  Neurological:  Negative for dizziness and light-headedness.   PHYSICAL EXAM: Vitals with BMI 04/23/2021 01/25/2021  01/18/2021  Height _0  _1  _2   Weight 376 lbs 13 oz 410 lbs 6 oz 411 lbs  BMI 57.31 16.96 78.93  Systolic 810 175 102  Diastolic 67 80 75  Pulse 98 89 90    CONSTITUTIONAL: Appears older than stated age, nasal cannula oxygen, hemodynamically stable, no acute distress,   SKIN: Skin is warm and dry. No rash noted. No cyanosis. No pallor. No jaundice HEAD: Normocephalic and atraumatic.  EYES: No scleral icterus MOUTH/THROAT: Moist oral membranes.  NECK: JVD unable to evaluate due to neck stature and adipose tissue. No thyromegaly noted. No carotid bruits  LYMPHATIC: No visible cervical adenopathy.  CHEST Normal respiratory effort. No intercostal retractions  LUNGS: Decreased breath sounds bilaterally.  No stridor. No wheezes. No rales.  CARDIOVASCULAR: Regular, positive S1-S2, no murmurs rubs or gallops appreciated mostly secondary to tachycardia. ABDOMINAL: Obese, soft, nontender, nondistended, positive bowel sounds all 4 quadrants. No apparent ascites.  EXTREMITIES: Trace bilateral peripheral edema  HEMATOLOGIC: No significant bruising NEUROLOGIC: Oriented to person, place, and time. Nonfocal. Normal muscle tone.  PSYCHIATRIC: Normal mood and affect. Normal behavior. Cooperative  CARDIAC DATABASE: EKG: 12/18/2020: Normal sinus rhythm, 93 bpm,  without underlying ischemia or injury pattern.  Echocardiogram: 01/04/2021:  Left ventricle cavity is normal in size and wall thickness. Normal global wall motion. Normal LV systolic function with EF 63%. Normal diastolic filling pattern.  Mild (Grade I) mitral regurgitation.  Trace tricuspid regurgitation.  IVC is not seen to estimate PASP.    Stress Testing: No results found for this or any previous visit from the past 1095 days.  Heart Catheterization: Right and left heart catheterization 01/02/2021: LV 120/11, EDP 24 mmHg.  Ao 114/74, mean 89 mmHg.  No pressure gradient across the aortic valve.  Markedly elevated EDP.  LVEF 55 to  60%. Left main: Large vessel, normal. LAD: Large vessel.  Gives origin to 2 large D1 and D2.  Smooth and normal. Ramus intermediate: Moderate to large vessel, normal. CX: Gives origin to large OM1 and OM 2 and continues in a small AV groove branch.  Smooth and normal. RCA: Dominant.  Smooth and normal.  RA: 16/14, mean 13 mmHg.  RV 44/5, EDP 15 mmHg.  PA 46/16, mean 31 mmHg.  PA saturation 69%.  PW 22/21, mean 19 mmHg.  Impression: Normal coronary arteries.  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  Recommendation: Weight loss, gentle diuresis.   LABORATORY DATA: CBC Latest Ref Rng & Units 01/02/2021 01/02/2021 01/02/2021  WBC 3.4 - 10.8 x10E3/uL - - -  Hemoglobin 12.0 - 15.0 g/dL 14.6 14.6 14.6  Hematocrit 36.0 - 46.0 % 43.0 43.0 43.0  Platelets 150.0 - 400.0 K/uL - - -    CMP Latest Ref Rng & Units 02/22/2021 01/02/2021 01/02/2021  Glucose 70 - 99 mg/dL 102(H) - -  BUN 6 - 24 mg/dL 12 - -  Creatinine 0.57 - 1.00 mg/dL 0.83 - -  Sodium 134 - 144 mmol/L 142 141 141  Potassium 3.5 - 5.2 mmol/L 4.1 4.2 3.9  Chloride 96 - 106 mmol/L 95(L) - -  CO2 20 - 29 mmol/L 29 - -  Calcium 8.7 - 10.2 mg/dL 9.1 - -  Total Protein 6.0 - 8.3 g/dL - - -  Total Bilirubin 0.2 - 1.2 mg/dL - - -  Alkaline Phos 39 - 117 U/L - - -  AST 0 - 37 U/L - - -  ALT 0 - 35 U/L - - -    Lipid Panel  No results found for: CHOL, TRIG, HDL, CHOLHDL, VLDL, LDLCALC, LDLDIRECT, LABVLDL  No components found for: NTPROBNP Recent Labs    12/14/20 1030  PROBNP 28.0    No results for input(s): TSH in the last 8760 hours.  BMP Recent Labs    12/01/20 1454 12/02/20 0312 12/14/20 1030 12/26/20 1629 01/02/21 1335 01/02/21 1343 02/22/21 1438  NA 137 139 140 145* 141  141 141 142  K 4.0 4.1 4.3 4.3 3.9  4.0 4.2 4.1  CL 101 101 102 101  --   --  95*  CO2 _0 --   --  29  GLUCOSE 84 104* 83 67  --   --  102*  BUN _1 --   --  12  CREATININE 0.88 0.85 0.77 0.70   --   --  0.83  CALCIUM 8.6* 9.1 9.3 8.9  --   --  9.1  GFRNONAA >60 >60  --   --   --   --   --      HEMOGLOBIN A1C Lab Results  Component Value Date   HGBA1C  6.5 (H) 12/02/2020   MPG 139.85 12/02/2020   External Labs: Collected: 08/03/2020 Creatinine 0.79 mg/dL. eGFR: 78 mL/min per 1.73 m Potassium 4.4 AST 10, ALT 11, alkaline phosphatase 82 (all within normal limits) Total cholesterol 182, triglycerides 125, HDL 43, LDL 116 Hemoglobin A1c: 6.1  IMPRESSION:    ICD-10-CM   1. Shortness of breath  R06.02     2. WHO group 3 pulmonary arterial hypertension (HCC)  I27.23     3. Supplemental oxygen dependent  Z99.81     4. Sleep apnea, obstructive  G47.33     5. Diabetes mellitus without complication (HCC)  F02.6     6. Class 3 severe obesity due to excess calories with serious comorbidity and body mass index (BMI) of 50.0 to 59.9 in adult [facility])  E66.01    Z68.43        RECOMMENDATIONS: Abigail Vega is a 45 y.o. female whose past medical history and cardiac risk factors include: migraines, depression, type 2 diabetes, Obesity due to excess calories, history of pneumonia since childhood.  Shortness of breath /supplemental oxygen dependence / WHO group 3 pulmonary arterial hypertension (HCC) Shortness of breath has remained relatively stable since last office encounter. Has plans to follow-up with pulmonary medicine to see if she still requires 3 L nasal cannula oxygen. Left heart catheterization: Normal epicardial coronary arteries. Right heart catheterization: Mean PAP 31 mmHg most likely WHO group 3 PH and elevated LVEDP. Patient has implemented lifestyle changes and has lost 35 pounds since last office encounter. Appears overall euvolemic. In the past patient's BNP levels were also within normal limits. Will continue current diuretic regimen. Educated on the importance of continuing to improve her modifiable cardiovascular risk factors and possibly undergoing repeat  right heart catheterization to reevaluate hemodynamics and to screen for PAH.  Sleep apnea, obstructive Currently on BiPAP with nocturnal oxygen support. Plans to follow-up with sleep medicine in the coming weeks.  Diabetes mellitus without complication (HCC) Hemoglobin A1c has improved markedly and currently in the prediabetic range. Currently on medical therapy. Currently on rosuvastatin. Consider addition of ARB for renal protection-will defer to primary team  Class 3 severe obesity due to excess calories with serious comorbidity and body mass index (BMI) of 50.0 to 59.9 in adult [facility]) Body mass index is 57.29 kg/m. I reviewed with the patient the importance of diet, regular physical activity/exercise, weight loss.   Patient is educated on increasing physical activity gradually as tolerated.  With the goal of moderate intensity exercise for 30 minutes a day 5 days a week.  FINAL MEDICATION LIST END OF ENCOUNTER: No orders of the defined types were placed in this encounter.    There are no discontinued medications.   Current Outpatient Medications:    albuterol (VENTOLIN HFA) 108 (90 Base) MCG/ACT inhaler, Inhale 2 puffs into the lungs every 6 (six) hours as needed for wheezing or shortness of breath., Disp: , Rfl:    bumetanide (BUMEX) 1 MG tablet, TAKE 1 TABLET BY MOUTH IN THE MORNING, Disp: 30 tablet, Rfl: 3   buPROPion (WELLBUTRIN XL) 150 MG 24 hr tablet, Take 150 mg by mouth in the morning and at bedtime., Disp: , Rfl:    cefdinir (OMNICEF) 300 MG capsule, Take 300 mg by mouth 2 (two) times daily., Disp: , Rfl:    cetirizine (ZYRTEC) 10 MG tablet, Take 10 mg by mouth daily., Disp: , Rfl:    EPINEPHrine 0.3 mg/0.3 mL IJ SOAJ injection, Inject 0.3 mg into the  muscle as needed for anaphylaxis., Disp: , Rfl:    ibuprofen (ADVIL) 200 MG tablet, Take 600 mg by mouth every 8 (eight) hours as needed for moderate pain., Disp: , Rfl:    metFORMIN (GLUCOPHAGE) 500 MG tablet, Take 1,000 mg  by mouth daily with breakfast., Disp: , Rfl:    montelukast (SINGULAIR) 10 MG tablet, Take 10 mg by mouth every morning., Disp: , Rfl:    MOUNJARO 7.5 MG/0.5ML Pen, Inject into the skin once a week., Disp: , Rfl:    norethindrone-ethinyl estradiol-iron (LOESTRIN FE) 1.5-30 MG-MCG tablet, Take 1 tablet by mouth daily., Disp: , Rfl:    rosuvastatin (CRESTOR) 10 MG tablet, Take 10 mg by mouth daily., Disp: , Rfl:    sertraline (ZOLOFT) 50 MG tablet, Take 50 mg by mouth daily., Disp: , Rfl:    spironolactone (ALDACTONE) 25 MG tablet, Take 25 mg by mouth daily., Disp: , Rfl:    SUMAtriptan (IMITREX) 100 MG tablet, Take 100 mg by mouth every 2 (two) hours as needed for migraine. May repeat in 2 hours if headache persists or recurs., Disp: , Rfl:    UNABLE TO FIND, See admin instructions. Med Name: allergy injection every 3 weeks, Disp: , Rfl:   No orders of the defined types were placed in this encounter.   There are no Patient Instructions on file for this visit.   --Continue cardiac medications as reconciled in final medication list. --Return in about 3 months (around 07/24/2021) for Follow up , Dyspnea. Or sooner if needed. --Continue follow-up with your primary care physician regarding the management of your other chronic comorbid conditions.  Patient's questions and concerns were addressed to her satisfaction. She voices understanding of the instructions provided during this encounter.   This note was created using a voice recognition software as a result there may be grammatical errors inadvertently enclosed that do not reflect the nature of this encounter. Every attempt is made to correct such errors.  Rex Kras, [place], [facility]  Pager: [phone] Office: [phone]

## 2021-05-29 ENCOUNTER — Telehealth: Payer: Self-pay | Admitting: Internal Medicine

## 2021-05-31 NOTE — Telephone Encounter (Signed)
Sending to [facility] to be aware to keep an eye out for this form.

## 2021-06-01 NOTE — Telephone Encounter (Signed)
Form was completed Thursday and should be on its way to her.

## 2021-07-04 ENCOUNTER — Encounter (HOSPITAL_BASED_OUTPATIENT_CLINIC_OR_DEPARTMENT_OTHER): Payer: Self-pay

## 2021-07-04 ENCOUNTER — Emergency Department (HOSPITAL_BASED_OUTPATIENT_CLINIC_OR_DEPARTMENT_OTHER)
Admission: EM | Admit: 2021-07-04 | Discharge: 2021-07-04 | Disposition: A | Discharge status: Home / Self Care | Payer: BC Managed Care – PPO | Attending: Emergency Medicine | Admitting: Emergency Medicine

## 2021-07-04 ENCOUNTER — Other Ambulatory Visit: Payer: Self-pay

## 2021-07-04 ENCOUNTER — Emergency Department (HOSPITAL_BASED_OUTPATIENT_CLINIC_OR_DEPARTMENT_OTHER): Payer: BC Managed Care – PPO

## 2021-07-04 DIAGNOSIS — Y92410 Unspecified street and highway as the place of occurrence of the external cause: Secondary | ICD-10-CM | POA: Diagnosis not present

## 2021-07-04 DIAGNOSIS — Z23 Encounter for immunization: Secondary | ICD-10-CM | POA: Diagnosis not present

## 2021-07-04 DIAGNOSIS — S8991XA Unspecified injury of right lower leg, initial encounter: Secondary | ICD-10-CM | POA: Diagnosis present

## 2021-07-04 DIAGNOSIS — S8001XA Contusion of right knee, initial encounter: Secondary | ICD-10-CM | POA: Insufficient documentation

## 2021-07-04 DIAGNOSIS — Z79899 Other long term (current) drug therapy: Secondary | ICD-10-CM | POA: Diagnosis not present

## 2021-07-04 MED ORDER — TETANUS-DIPHTH-ACELL PERTUSSIS 5-2.5-18.5 LF-MCG/0.5 IM SUSY
0.5000 mL | PREFILLED_SYRINGE | Freq: Once | INTRAMUSCULAR | Status: AC
  Administered 2021-07-04: 0.5 mL via INTRAMUSCULAR
  Filled 2021-07-04: qty 0.5

## 2021-07-04 NOTE — Discharge Instructions (Signed)
Please refer to the attached instructions. Ibuprofen and/or tylenol for discomfort
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
## 2021-07-24 ENCOUNTER — Ambulatory Visit: Payer: BC Managed Care – PPO | Admitting: Cardiology

## 2021-08-08 NOTE — Progress Notes (Signed)
HPI ?F never smoker followed for Acute on Chronic Respiratory Failure with Hypoxia, OHS, Complicated by Asthma, Allergic Rhinitis, Morbid Obesity, DM2, Depression, WHO group 3PHTN,  ?Dr Maxwell Caul managing OSA- NPSG 01/15/21 AHI 86.3/ hr, Nocturnal Hypoxemia with mean O2 sat 70%. ?Dr Einar Gip did R/L Cath 8/9 Mild PHTN, mean 19, PA 46/16 ?O2 2 L sleep- ROTECH POC 3L exertion>> [employer] for concentrator and POC ?Walk test 12/14/20- - RA- 89%/ HR 96/ min. Desat after 1/4 lap to 86%, required 2L O2 to hold at 90% on 2 L. ? ?================================================================================== ? ? ?01/25/21-  45 yoF never smoker followed for Acute on Chronic Respiratory Failure with edema/ pneumonia, Probable OHS, Complicated by Asthma, Allergic Rhinitis, Morbid Obesity, DM2, Depression, WHO group 3PHTN ?O2 2 L sleep- ROTECH POC 3L exertion ?ACT score today 21 ?Dr Maxwell Caul managing OSA- NPSG 01/15/21 AHI 86.3/ hr, Nocturnal Hypoxemia with mean O2 sat 70%. ?      BIPAP titration pending and expected to include documentation to continue nocturnal oxygen.  ?Dr Einar Gip did R/L Cath 8/9 Mild PHTN, mean 19, PA 46/16. His group follows cardiac. ?Allergist managing AR/ Asthma (singulair, ventolin hfa, zyrtec) ?Body weight today-410 lbs ?Covid vax-3 Phizer ?Flu vax-no ?She is trying to work as Pharmacist, hospital. Portable tanks are prohibitively clumsy- needs POC.which can be provided by [employer] since [employer] doesn't have these.  ?She understands weight loss is key to turning this around. She is not interested in bariatric surgery now. Says she lost weight before with [employer] and wants to do that again.  ?CXR 12/14/20-  ?No active cardiopulmonary disease. ? ?08/09/21- 46 yoF never smoker followed for Acute on Chronic Respiratory Failure with edema/ pneumonia, Probable OHS, Complicated by Asthma, Allergic Rhinitis, Morbid Obesity, DM2, Depression, WHO group 3PHTN ?O2 2 L sleep- ROTECH POC 3L exertion>>01/26/21   [employer] 3L sleep and POC ?ACT  score today 21 ?Dr Maxwell Caul managing OSA- NPSG 01/15/21 AHI 86.3/ hr, Nocturnal Hypoxemia with mean O2 sat 70%.Marland Kitchen  ?Dr Einar Gip did R/L Cath 01/02/21 Mild PHTN, mean 19, PA 46/16. His group follows cardiac. ?Allergist managing AR/ Asthma (singulair, ventolin hfa, zyrtec) ?Body weight today- 327 lbs ?Covid vax-3 Phizer ?Flu vax- ?------Patient is doing good, no concerns. Wants to be off oxygen ?She has lost almost 75 pounds now and noted good her home O2 saturation is staying over 90% in the 3 or 4 hours between return home from work and bedtime.  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  She continues to sleep with her BiPAP machine with O2 3 L from concentrator.  Her goal is to lose a total of 100 pounds by the end of the school year. ? ?ROS-see HPI   + = positive ?Constitutional:    +weight loss, night sweats, fevers, chills, +fatigue, lassitude. ?HEENT:    headaches, difficulty swallowing, tooth/dental problems, sore throat,  ?     sneezing, itching, ear ache, nasal congestion, post nasal drip, snoring ?CV:    chest pain, orthopnea, PND, swelling in lower extremities, anasarca,          dizziness, palpitations ?Resp:   +shortness of breath with exertion or at rest.   ?             productive cough,   non-productive cough, coughing up of blood.   ?           change in color of mucus.  +wheezing.   ?Skin:    rash  or lesions. ?GI:  No-   heartburn, indigestion, abdominal pain, nausea, vomiting, diarrhea,  ?               change in bowel habits, loss of appetite ?GU: dysuria, change in color of urine, no urgency or frequency.   flank pain. ?MS:   joint pain, stiffness, decreased range of motion, back pain. ?Neuro-     nothing unusual ?Psych:  change in mood or affect.  depression or anxiety.   memory loss. ? ?OBJ- Physical Exam ?General- Alert, Oriented, Affect-appropriate, Distress- none acute, +morbidly obese. Mildly tachypneic on O2 2L during quiet conversation. ?Skin- rash-none, lesions-  none, excoriation- none ?Lymphadenopathy- none ?Head- atraumatic ?           Eyes- Gross vision intact, PERRLA, conjunctivae and secretions clear ?           Ears- Hearing, canals-normal ?           Nose- Clear, no-Septal dev, mucus, polyps, erosion, perforation  ?           Throat- Mallampati IV, mucosa clear , drainage- none, tonsils-absent ?Neck- flexible , trachea midline, no stridor , thyroid nl, carotid no bruit ?Chest - symmetrical excursion , unlabored ?          Heart/CV- RRR , no murmur , no gallop  , no rub, nl s1 s2 ?                          - JVD- none , edema+3, stasis changes+,  varices- none ?          Lung- clear to P&A, wheeze- none, cough- none , dullness-none, rub- none ?          Chest wall-  ?Abd-  ?Br/ Gen/ Rectal- Not done, not indicated ?Extrem- cyanosis- none, clubbing, none, atrophy- none, strength- nl ?Neuro- grossly intact to observation ? ?

## 2021-08-09 ENCOUNTER — Encounter: Payer: Self-pay | Admitting: Internal Medicine

## 2021-08-09 ENCOUNTER — Other Ambulatory Visit: Payer: Self-pay

## 2021-08-09 ENCOUNTER — Ambulatory Visit: Payer: BC Managed Care – PPO | Admitting: Internal Medicine

## 2021-08-09 DIAGNOSIS — J9611 Chronic respiratory failure with hypoxia: Secondary | ICD-10-CM | POA: Diagnosis not present

## 2021-08-09 DIAGNOSIS — I2729 Other secondary pulmonary hypertension: Secondary | ICD-10-CM

## 2021-08-09 DIAGNOSIS — Z6841 Body Mass Index (BMI) 40.0 and over, adult: Secondary | ICD-10-CM

## 2021-08-09 NOTE — Assessment & Plan Note (Signed)
Followed by cardiology ?

## 2021-08-09 NOTE — Patient Instructions (Addendum)
Ok to stay off daytime O2 as long as your oxygen saturation stays 90% or better. ? ?I would continue sleeping with oxygen through your BIPAP for now. ? ?Please call if we can help ?

## 2021-08-09 NOTE — Assessment & Plan Note (Signed)
Justifiably proud of weight loss, reflecting her efforts as diuresis. ?Keep working on it.Marland Kitchen ?

## 2021-08-09 NOTE — Assessment & Plan Note (Signed)
She benefits from oxygen but clearly doing better. ?Plan-take POC to work. Check O2 sat 1-2x/ day. Ok to stay off O2 at work as long as O2 sat stays >/= 90%. For now, continue O2 with BIPAP for sleep.  ?

## 2021-08-16 ENCOUNTER — Encounter: Payer: Self-pay | Admitting: Cardiology

## 2021-08-16 ENCOUNTER — Other Ambulatory Visit: Payer: Self-pay

## 2021-08-16 ENCOUNTER — Ambulatory Visit: Payer: BC Managed Care – PPO | Admitting: Cardiology

## 2021-08-16 VITALS — BP 112/72 | HR 87 | Temp 97.2°F | Resp 16 | Ht 68.0 in | Wt 326.4 lb

## 2021-08-16 DIAGNOSIS — G4733 Obstructive sleep apnea (adult) (pediatric): Secondary | ICD-10-CM

## 2021-08-16 DIAGNOSIS — R0602 Shortness of breath: Secondary | ICD-10-CM

## 2021-08-16 DIAGNOSIS — E119 Type 2 diabetes mellitus without complications: Secondary | ICD-10-CM

## 2021-08-16 MED ORDER — BUMETANIDE 1 MG PO TABS: 1.0000 mg | ORAL_TABLET | Freq: Every day | ORAL | 0 refills | Status: DC | PRN

## 2021-08-16 NOTE — Progress Notes (Signed)
? ?Date:  08/16/2021  ? ?ID:  Abigail O Utke, DOB [birth year], MRN [id number] ? ?PCP:  Marda Stalker, PA-C  ?Cardiologist:  Rex Kras, DO, [facility]  (established care 12/18/2020) ? ?Date: 08/16/21 ?Last Office Visit: 04/23/2021 ? ?Chief Complaint  ?Patient presents with  ? Shortness of Breath  ? Follow-up  ?  3 months  ? ? ?HPI  ?Abigail Vega is a 46 y.o. 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 ? ?She is referred to the office at the request of WMarda Stalker PA-C for evaluation of dyspnea. ? ?After colonoscopy in 2022 patient has required supplemental oxygen and has been evaluated by pulmonary medicine for dyspnea.  Later referred to cardiology as well.  Given the drastic change in her overall functional status and dyspnea concerning for anginal equivalents she underwent left and right heart catheterization.  She was noted to have normal epicardial coronary arteries with evidence of mild pulmonary hypertension suggestive of possible WHO group 3 and 2 given the elevated LVEDP. ? ?Patient was started on diuretic therapy and has also been evaluated by sleep medicine and is currently on BiPAP.  Patient was requiring nasal cannula oxygen around-the-clock but recently after an additional 50 pounds of weight loss is using oxygen on as needed basis.  She still requires nocturnal oxygen to prevent hypoxemia. ? ?She is currently being followed by weight loss clinic biweekly and her cholesterol medications were recently reduced.  Since last office visit patient denies any chest pain or heart failure symptoms.   ? ? ?FUNCTIONAL STATUS: ?No structured exercise program or daily routine. ? ?ALLERGIES: ?Allergies  ?Allergen Reactions  ? Other Cough   ?  Tree, molds, cat, dogs , mites ?Reports multiple "environmental" allergies  ? ? ?MEDICATION LIST PRIOR TO VISIT: ?Current Meds  ?Medication Sig  ? albuterol (VENTOLIN HFA) 108 (90 Base) MCG/ACT inhaler Inhale 2 puffs into the lungs every 6 (six) hours as needed for wheezing or shortness of breath.  ? Ascorbic Acid (VITAMIN C PO) Take 1,500 mg by mouth daily.  ? buPROPion (WELLBUTRIN XL) 150 MG 24 hr tablet Take 150 mg by mouth in the morning and at bedtime.  ? cetirizine (ZYRTEC) 10 MG tablet Take 10 mg by mouth daily.  ? cyanocobalamin 1000 MCG tablet Take 1,000 mcg by mouth daily.  ? EPINEPHrine 0.3 mg/0.3 mL IJ SOAJ injection Inject 0.3 mg into the muscle as needed for anaphylaxis.  ? ibuprofen (ADVIL) 200 MG tablet Take 600 mg by mouth every 8 (eight) hours as needed for moderate pain.  ? MAGNESIUM GLYCINATE PO Take 500 mg by mouth daily.  ? metFORMIN (GLUCOPHAGE) 500 MG tablet Take 1,000 mg by mouth daily with breakfast.  ? montelukast (SINGULAIR) 10 MG tablet Take 10 mg by mouth every morning.  ? MOUNJARO 7.5 MG/0.5ML Pen Inject into the skin once a week.  ? norethindrone-ethinyl estradiol-iron (BLISOVI FE 1.5/30) 1.5-30 MG-MCG tablet Take 1 tablet by mouth daily.  ? norethindrone-ethinyl estradiol-iron (LOESTRIN FE) 1.5-30 MG-MCG tablet Take 1 tablet by mouth daily.  ? Omega-3 Fatty Acids (FISH OIL) 1000 MG CAPS Take 1,000 mg by mouth daily.  ? rosuvastatin (CRESTOR) 10 MG tablet Take 5 mg by mouth daily.  ? sertraline (ZOLOFT) 50 MG tablet Take 50  mg by mouth daily.  ? spironolactone (ALDACTONE) 25 MG tablet Take 25 mg by mouth 2 (two) times daily.  ? SUMAtriptan (IMITREX) 100 MG tablet Take 100 mg by mouth every 2 (two) hours as needed for migraine. May repeat in 2 hours if headache persists or recurs.  ? UNABLE TO FIND See admin instructions. Med Name: allergy injection every 3 weeks  ? [DISCONTINUED] bumetanide (BUMEX) 1 MG tablet TAKE 1 TABLET BY MOUTH IN THE MORNING  ?  ? ?PAST MEDICAL  HISTORY: ?Past Medical History:  ?Diagnosis Date  ? Asthma   ? Depression   ? Diabetes mellitus without complication ([place])   ? Seasonal allergies   ? ? ?PAST SURGICAL HISTORY: ?Past Surgical History:  ?Procedure Laterality Date  ? COLONOSCOPY WITH PROPOFOL N/A 12/01/2020  ? Procedure: COLONOSCOPY WITH PROPOFOL;  Surgeon: Clarene Essex, MD;  Location: [facility];  Service: Endoscopy;  Laterality: N/A;  ? NASAL SEPTOPLASTY W/ TURBINOPLASTY  1991  ? RIGHT/LEFT HEART CATH AND CORONARY ANGIOGRAPHY N/A 01/02/2021  ? Procedure: RIGHT/LEFT HEART CATH AND CORONARY ANGIOGRAPHY;  Surgeon: Adrian Prows, MD;  Location: [place] [facility];  Service: Cardiovascular;  Laterality: N/A;  ? TONSILECTOMY, ADENOIDECTOMY, BILATERAL MYRINGOTOMY AND TUBES  1981  ? TYMPANOPLASTY  1986  ? ? ?FAMILY HISTORY: ?Mom and dad are both alive.  No significant cardiac history.  Patient denies any premature CAD or sudden cardiac death in the family. ? ?SOCIAL HISTORY:  ?The patient  reports that she has never smoked. She has never used smokeless tobacco. She reports that she does not drink alcohol and does not use drugs. ? ?REVIEW OF SYSTEMS: ?Review of Systems  ?Constitutional: Negative for chills and fever.  ?HENT:  Negative for hoarse voice and nosebleeds.   ?Eyes:  Negative for discharge, double vision and pain.  ?Cardiovascular:  Positive for dyspnea on exertion (improved). Negative for chest pain, claudication, leg swelling, near-syncope, orthopnea, palpitations, paroxysmal nocturnal dyspnea and syncope.  ?Respiratory:  Positive for shortness of breath (improved). Negative for hemoptysis.   ?Musculoskeletal:  Negative for muscle cramps and myalgias.  ?Gastrointestinal:  Negative for abdominal pain, constipation, diarrhea, hematemesis, hematochezia, melena, nausea and vomiting.  ?Neurological:  Negative for dizziness and light-headedness.  ? ?PHYSICAL EXAM: ? ?  08/16/2021  ?  3:29 PM 08/09/2021  ?  4:16 PM 07/04/2021  ?  6:03 PM  ?Vitals with BMI  ?Height  5' 8" 5' 8" 5' 8"  ?Weight 326 lbs 6 oz 327 lbs 6 oz 342 lbs  ?BMI 49.64 49.79 52.01  ?Systolic 355 732   ?Diastolic 72 60   ?Pulse 87 85   ? ? ?CONSTITUTIONAL: Appears older than stated age, hemodynamically stable, no acute distress,   ?SKIN: Skin is warm and dry. No rash noted. No cyanosis. No pallor. No jaundice ?HEAD: Normocephalic and atraumatic.  ?EYES: No scleral icterus ?MOUTH/THROAT: Moist oral membranes.  ?NECK: JVD unable to evaluate due to neck stature and adipose tissue. No thyromegaly noted. No carotid bruits  ?LYMPHATIC: No visible cervical adenopathy.  ?CHEST Normal respiratory effort. No intercostal retractions  ?LUNGS: Decreased breath sounds bilaterally.  No stridor. No wheezes. No rales.  ?CARDIOVASCULAR: Regular, positive S1-S2, no murmurs rubs or gallops appreciated mostly secondary to tachycardia. ?ABDOMINAL: Obese, soft, nontender, nondistended, positive bowel sounds all 4 quadrants. No apparent ascites.  ?EXTREMITIES: Trace bilateral peripheral edema  ?HEMATOLOGIC: No significant bruising ?NEUROLOGIC: Oriented to person, place, and time. Nonfocal. Normal muscle tone.  ?PSYCHIATRIC: Normal mood and affect. Normal behavior. Cooperative ? ?  CARDIAC DATABASE: ?EKG: ?12/18/2020: Normal sinus rhythm, 93 bpm, without underlying ischemia or injury pattern. ? ?Echocardiogram: ?01/04/2021:  ?Left ventricle cavity is normal in size and wall thickness. Normal global wall motion. Normal LV systolic function with EF 63%. Normal diastolic filling pattern.  ?Mild (Grade I) mitral regurgitation.  ?Trace tricuspid regurgitation.  ?IVC is not seen to estimate PASP.  ?  ?Stress Testing: ?No results found for this or any previous visit from the past 1095 days. ? ?Heart Catheterization: ?Right and left heart catheterization 01/02/2021: ?LV 120/11, EDP 24 mmHg.  Ao 114/74, mean 89 mmHg.  No pressure gradient across the aortic valve.  Markedly elevated EDP.  LVEF 55 to 60%. ?Left main: Large vessel, normal. ?LAD: Large  vessel.  Gives origin to 2 large D1 and D2.  Smooth and normal. ?[place]amus intermediate: Moderate to large vessel, normal. ?CX: Gives origin to large OM1 and OM 2 and continues in a small AV groove branch.  Smooth a

## 2021-12-08 NOTE — Progress Notes (Signed)
HPI F never smoker followed for Acute on Chronic Respiratory Failure with Hypoxia, OHS, Complicated by Asthma, Allergic Rhinitis, Morbid Obesity, DM2, Depression, WHO group 3PHTN,  Dr Maxwell Caul managing OSA- NPSG 01/15/21 AHI 86.3/ hr, Nocturnal Hypoxemia with mean O2 sat 70%. Dr Einar Gip did R/L Cath 8/9 Mild PHTN, mean 19, PA 46/16 O2 2 L sleep- ROTECH POC 3L exertion>> [employer] for concentrator and POC Walk test 12/14/20- - RA- 89%/ HR 96/ min. Desat after 1/4 lap to 86%, required 2L O2 to hold at 90% on 2 L.  ==================================================================================   08/09/21- 23 yoF never smoker followed for Acute on Chronic Respiratory Failure with edema/ pneumonia, Probable OHS, Complicated by Asthma, Allergic Rhinitis, Morbid Obesity, DM2, Depression, WHO group 3PHTN O2 2 L sleep- ROTECH POC 3L exertion>>01/26/21   [employer] 3L sleep and POC ACT score today 21 Dr Maxwell Caul managing OSA- NPSG 01/15/21 AHI 86.3/ hr, Nocturnal Hypoxemia with mean O2 sat 70%..  Dr Einar Gip did R/L Cath 01/02/21 Mild PHTN, mean 19, PA 46/16. His group follows cardiac. Allergist managing AR/ Asthma (singulair, ventolin hfa, zyrtec) Body weight today- 327 lbs Covid vax-3 [employer]izer Flu vax- ------Patient is doing good, no concerns. Wants to be off oxygen She has lost almost 75 pounds now and noted good her home O2 saturation is staying over 90% in the 3 or 4 hours between return home from work and bedtime.  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  She continues to sleep with her BiPAP machine with O2 3 L from concentrator.  Her goal is to lose a total of 100 pounds by the end of the school year.  12/10/21- 89 yoF never smoker followed for Acute on Chronic Hypoxic  Respiratory Failure with edema/ pneumonia, Probable OHS, Complicated by Asthma, Allergic Rhinitis, Morbid Obesity, DM2, Depression, WHO group 3PHTN, O2 2 L sleep- ROTECH POC 3L exertion>>01/26/21   [employer] 3L sleep and  POC [facility]/(Dr Orvil Feil)- Asthma, [facility], PHTN Body weight today-283 lbs No longer on O2- returned it all. Continues BIPAP/ [employer]- probably 25/25 but we are seeking download. Still managed by [facility]. 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 Back to teaching.  Breathing is comfortable w/o cough/wheeze.   ROS-see HPI   + = positive Constitutional:    +weight loss, night sweats, fevers, chills, +fatigue, lassitude. HEENT:    headaches, difficulty swallowing, tooth/dental problems, sore throat,       sneezing, itching, ear ache, nasal congestion, post nasal drip, snoring CV:    chest pain, orthopnea, PND, swelling in lower extremities, anasarca,          dizziness, palpitations Resp:   +shortness of breath with exertion or at rest.                productive cough,   non-productive cough, coughing up of blood.              change in color of mucus.  +wheezing.   Skin:    rash or lesions. GI:  No-   heartburn, indigestion, abdominal pain, nausea, vomiting, diarrhea,                 change in bowel habits, loss of appetite GU: dysuria, change in color of urine, no urgency or frequency.   flank pain. MS:   joint pain, stiffness, decreased range of motion, back pain. Neuro-     nothing unusual Psych:  change in mood or affect.  depression or anxiety.   memory loss.  OBJ- Physical Exam General- Alert, Oriented, Affect-appropriate, Distress- none acute, + still morbidly obese.  Skin- rash-none, lesions- none, excoriation- none Lymphadenopathy- none Head- atraumatic            Eyes- Gross vision intact, PERRLA, conjunctivae and secretions clear            Ears- Hearing, canals-normal            Nose- Clear, no-Septal dev, mucus, polyps, erosion, perforation             Throat- Mallampati IV, mucosa clear , drainage- none, tonsils-absent Neck- flexible , trachea midline, no stridor , thyroid nl, carotid no bruit Chest - symmetrical excursion , unlabored            Heart/CV- RRR , no murmur , no gallop  , no rub, nl s1 s2                           - JVD- none , edema+3, stasis changes+,  varices- none           Lung- clear to P&A, wheeze- none, cough- none , dullness-none, rub- none           Chest wall-  Abd-  Br/ Gen/ Rectal- Not done, not indicated Extrem- cyanosis- none, clubbing, none, atrophy- none, strength- nl Neuro- grossly intact to observation

## 2021-12-10 ENCOUNTER — Ambulatory Visit: Payer: BC Managed Care – PPO | Admitting: Internal Medicine

## 2021-12-10 ENCOUNTER — Encounter: Payer: Self-pay | Admitting: Internal Medicine

## 2021-12-10 DIAGNOSIS — Z6841 Body Mass Index (BMI) 40.0 and over, adult: Secondary | ICD-10-CM | POA: Diagnosis not present

## 2021-12-10 DIAGNOSIS — J9611 Chronic respiratory failure with hypoxia: Secondary | ICD-10-CM | POA: Diagnosis not present

## 2021-12-10 DIAGNOSIS — I2729 Other secondary pulmonary hypertension: Secondary | ICD-10-CM

## 2021-12-10 NOTE — Patient Instructions (Signed)
Glad you are doing better !  Let's see you again in 6 months and decide then what your care needs are.

## 2021-12-28 ENCOUNTER — Other Ambulatory Visit: Payer: Self-pay | Admitting: Nurse Practitioner

## 2021-12-28 DIAGNOSIS — Z1231 Encounter for screening mammogram for malignant neoplasm of breast: Secondary | ICD-10-CM

## 2022-01-23 NOTE — Assessment & Plan Note (Signed)
She will decide whether to stay with [facility] as Dr Ernst Spell, or switch to [place], knowing I will eventually retire. Hypoxia much improved with weight loss.

## 2022-01-23 NOTE — Assessment & Plan Note (Signed)
Still working to get weight down more.

## 2022-01-23 NOTE — Assessment & Plan Note (Signed)
Followed by cardiology. Combination of OHS and OSA

## 2022-02-01 ENCOUNTER — Ambulatory Visit
Admission: RE | Admit: 2022-02-01 | Discharge: 2022-02-01 | Disposition: A | Discharge status: Home / Self Care | Payer: BC Managed Care – PPO | Source: Ambulatory Visit | Attending: Nurse Practitioner | Admitting: Nurse Practitioner

## 2022-02-01 DIAGNOSIS — Z1231 Encounter for screening mammogram for malignant neoplasm of breast: Secondary | ICD-10-CM

## 2022-02-18 ENCOUNTER — Ambulatory Visit: Payer: BC Managed Care – PPO | Admitting: Cardiology

## 2022-02-18 ENCOUNTER — Encounter: Payer: Self-pay | Admitting: Cardiology

## 2022-02-18 VITALS — BP 116/75 | HR 61 | Temp 98.5°F | Resp 16 | Ht 68.0 in | Wt 287.8 lb

## 2022-02-18 DIAGNOSIS — E119 Type 2 diabetes mellitus without complications: Secondary | ICD-10-CM

## 2022-02-18 DIAGNOSIS — R0602 Shortness of breath: Secondary | ICD-10-CM

## 2022-02-18 DIAGNOSIS — G4733 Obstructive sleep apnea (adult) (pediatric): Secondary | ICD-10-CM

## 2022-02-18 NOTE — Progress Notes (Signed)
Date:  02/18/2022   ID:  Abigail O Duecker, DOB 19-Oct-1975, MRN [id number]  PCP:  Linus Mako, NP  Cardiologist:  Rex Kras, DO, [facility]  (established care 12/18/2020)  Date: 02/18/22 Last Office Visit: 08/16/2021  Chief Complaint  Patient presents with   Shortness of Breath   Follow-up    6 months    HPI  Abigail Vega is a 46 y.o. 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  Referred to the practice for evaluation of dyspnea back in 2022.  She underwent extensive cardiovascular testing including left and right heart catheterization.  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  Since then she has implemented lifestyle changes, followed at the weight loss clinic, medications have been titrated, and has lost significant amount of weight.  At the time of the last office visit in March 2023 she had lost 50 pounds when compared to her prior visit and today she is lost an additional 39 pounds since March 2023.  She had fasting labs w/ PCP earlier today and it should reflect how her lipids reflect after stopping crestor. Her A1C is pending as well. Since the last visit she is requiring less BiPAP support as well.     FUNCTIONAL STATUS: No structured exercise program or daily routine.  ALLERGIES: Allergies  Allergen Reactions   Other Cough    Tree, molds, cat, dogs , mites Reports multiple "environmental" allergies    MEDICATION LIST PRIOR TO VISIT: Current Meds  Medication Sig   albuterol (VENTOLIN HFA) 108 (90 Base) MCG/ACT inhaler Inhale 2 puffs into the lungs every 6 (six) hours as needed for wheezing or shortness of breath.   Ascorbic Acid (VITAMIN C PO) Take  1,500 mg by mouth daily.   buPROPion (WELLBUTRIN XL) 150 MG 24 hr tablet Take 150 mg by mouth in the morning and at bedtime.   cetirizine (ZYRTEC) 10 MG tablet Take 10 mg by mouth daily.   [employer] TRIPLE MAGNESIUM COMPLEX PO Take 1 tablet by mouth daily.   EPINEPHrine 0.3 mg/0.3 mL IJ SOAJ injection Inject 0.3 mg into the muscle as needed for anaphylaxis.   MAGNESIUM GLYCINATE PO Take 500 mg by mouth daily.   metFORMIN (GLUCOPHAGE) 500 MG tablet Take 1,000 mg by mouth daily with breakfast.   montelukast (SINGULAIR) 10 MG tablet Take 10 mg by mouth every morning.   norethindrone-ethinyl estradiol-iron (BLISOVI FE 1.5/30) 1.5-30 MG-MCG tablet Take 1 tablet by mouth daily.   Omega-3 Fatty Acids (FISH OIL) 1000 MG CAPS Take 1,000 mg by mouth daily.   omeprazole (PRILOSEC) 40 MG capsule Take 40 mg by mouth daily.   sertraline (ZOLOFT) 50 MG tablet Take 50 mg by mouth daily.   spironolactone (ALDACTONE) 25 MG tablet Take 25 mg by mouth 2 (two) times daily.   SUMAtriptan (IMITREX) 100 MG tablet Take 100 mg by mouth every 2 (two) hours as needed for migraine. May repeat in 2 hours if headache persists or recurs.   UNABLE TO FIND See admin instructions. Med Name: allergy injection every 3 weeks   VICTOZA 18 MG/3ML SOPN Inject 1.8 mg into the skin daily.     PAST MEDICAL HISTORY: Past Medical History:  Diagnosis Date  Asthma    Depression    Diabetes mellitus without complication ([place])    Hyperlipidemia    Hypertension    Seasonal allergies     PAST SURGICAL HISTORY: Past Surgical History:  Procedure Laterality Date   COLONOSCOPY WITH PROPOFOL N/A 12/01/2020   Procedure: COLONOSCOPY WITH PROPOFOL;  Surgeon: Clarene Essex, MD;  Location: [facility];  Service: Endoscopy;  Laterality: N/A;   NASAL SEPTOPLASTY W/ TURBINOPLASTY  1991   RIGHT/LEFT HEART CATH AND CORONARY ANGIOGRAPHY N/A 01/02/2021   Procedure: RIGHT/LEFT HEART CATH AND CORONARY ANGIOGRAPHY;  Surgeon: Adrian Prows, MD;  Location: [facility];  Service: Cardiovascular;  Laterality: N/A;   TONSILECTOMY, ADENOIDECTOMY, BILATERAL MYRINGOTOMY AND TUBES  1981   TYMPANOPLASTY  1986    FAMILY HISTORY: Mom and dad are both alive.  No significant cardiac history.  Patient denies any premature CAD or sudden cardiac death in the family.  SOCIAL HISTORY:  The patient  reports that she has never smoked. She has never used smokeless tobacco. She reports that she does not drink alcohol and does not use drugs.  REVIEW OF SYSTEMS: Review of Systems  Cardiovascular:  Negative for chest pain, claudication, dyspnea on exertion, irregular heartbeat, leg swelling, near-syncope, orthopnea, palpitations, paroxysmal nocturnal dyspnea and syncope.  Respiratory:  Negative for shortness of breath.   Hematologic/Lymphatic: Negative for bleeding problem.  Musculoskeletal:  Negative for muscle cramps and myalgias.  Neurological:  Negative for dizziness and light-headedness.    PHYSICAL EXAM:    02/18/2022    3:50 PM 12/10/2021    9:39 AM 08/16/2021    3:29 PM  Vitals with BMI  Height _0  _1  _2   Weight 287 lbs 13 oz 283 lbs 326 lbs 6 oz  BMI 43.77 85.46 27.03  Systolic 500 938 182  Diastolic 75 70 72  Pulse 61 92 87    CONSTITUTIONAL: Appears older than stated age, hemodynamically stable, no acute distress,   SKIN: Skin is warm and dry. No rash noted. No cyanosis. No pallor. No jaundice HEAD: Normocephalic and atraumatic.  EYES: No scleral icterus MOUTH/THROAT: Moist oral membranes.  NECK: No JVP. No thyromegaly noted. No carotid bruits  CHEST Normal respiratory effort. No intercostal retractions  LUNGS: Decreased breath sounds bilaterally.  No stridor. No wheezes. No rales.  CARDIOVASCULAR: Regular, positive S1-S2, no murmurs rubs or gallops appreciated mostly secondary to tachycardia. ABDOMINAL: Obese, soft, nontender, nondistended, positive bowel sounds all 4 quadrants. No apparent ascites.  EXTREMITIES: Trace  bilateral peripheral edema, mild erythema bilaterally.  HEMATOLOGIC: No significant bruising NEUROLOGIC: Oriented to person, place, and time. Nonfocal. Normal muscle tone.  PSYCHIATRIC: Normal mood and affect. Normal behavior. Cooperative  CARDIAC DATABASE: EKG: 02/18/2022: Normal sinus rhythm, 76 bpm, normal axis, without underlying ischemia injury pattern.  Echocardiogram: 01/04/2021:  Left ventricle cavity is normal in size and wall thickness. Normal global wall motion. Normal LV systolic function with EF 63%. Normal diastolic filling pattern.  Mild (Grade I) mitral regurgitation.  Trace tricuspid regurgitation.  IVC is not seen to estimate PASP.    Stress Testing: No results found for this or any previous visit from the past 1095 days.  Heart Catheterization: Right and left heart catheterization 01/02/2021: LV 120/11, EDP 24 mmHg.  Ao 114/74, mean 89 mmHg.  No pressure gradient across the aortic valve.  Markedly elevated EDP.  LVEF 55 to 60%. Left main: Large vessel, normal. LAD: Large vessel.  Gives origin to 2 large D1 and D2.  Smooth and  normal. Ramus intermediate: Moderate to large vessel, normal. CX: Gives origin to large OM1 and OM 2 and continues in a small AV groove branch.  Smooth and normal. RCA: Dominant.  Smooth and normal.  RA: 16/14, mean 13 mmHg.  RV 44/5, EDP 15 mmHg.  PA 46/16, mean 31 mmHg.  PA saturation 69%.  PW 22/21, mean 19 mmHg.  Impression: Normal coronary arteries.  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  Recommendation: Weight loss, gentle diuresis.   LABORATORY DATA:    Latest Ref Rng & Units 01/02/2021    1:43 PM 01/02/2021    1:35 PM 12/26/2020    4:28 PM  CBC  WBC 3.4 - 10.8 x10E3/uL   10.8   Hemoglobin 12.0 - 15.0 g/dL 14.6  14.6    14.6  14.3   Hematocrit 36.0 - 46.0 % 43.0  43.0    43.0  44.2        Latest Ref Rng & Units 02/22/2021    2:38 PM 01/02/2021    1:43 PM 01/02/2021    1:35 PM  CMP  Glucose  70 - 99 mg/dL 102     BUN 6 - 24 mg/dL 12     Creatinine 0.57 - 1.00 mg/dL 0.83     Sodium 134 - 144 mmol/L 142  141  141    141   Potassium 3.5 - 5.2 mmol/L 4.1  4.2  4.0    3.9   Chloride 96 - 106 mmol/L 95     CO2 20 - 29 mmol/L 29     Calcium 8.7 - 10.2 mg/dL 9.1       Lipid Panel  No results found for: "CHOL", "TRIG", "HDL", "CHOLHDL", "VLDL", "LDLCALC", "LDLDIRECT", "LABVLDL"  No components found for: "NTPROBNP" No results for input(s): "PROBNP" in the last 8760 hours.  No results for input(s): "TSH" in the last 8760 hours.  BMP Recent Labs    02/22/21 1438  NA 142  K 4.1  CL 95*  CO2 29  GLUCOSE 102*  BUN 12  CREATININE 0.83  CALCIUM 9.1    HEMOGLOBIN A1C Lab Results  Component Value Date   HGBA1C 6.5 (H) 12/02/2020   MPG 139.85 12/02/2020   External Labs: Collected: 08/03/2020 Creatinine 0.79 mg/dL. eGFR: 78 mL/min per 1.73 m Potassium 4.4 AST 10, ALT 11, alkaline phosphatase 82 (all within normal limits) Total cholesterol 182, triglycerides 125, HDL 43, LDL 116 Hemoglobin A1c: 6.1  External Labs:  Name Result Date Reference Range  Hemoglobin A1c   2021-08-15    eAG 105      Hgb A1c 5.3   3.5-4.5  Comp Metabolic Panel   6256-38-93    Glucose 86   70-99  BUN 14   6-26  Creatinine 0.81   0.60-1.30  eGFR2021 90   >60  Sodium 140   136-145  Potassium 4.1   3.5-5.5  Chloride 101   98-107  CO2 31   22-32  Anion Gap 11.9   6.0-20.0  Calcium 9.6   8.6-10.3  CA-corrected 9.37   8.60-10.30  Protein, Total 7.5   6.0-8.3  Albumin 4.2   3.4-4.8  TBIL 0.6   0.3-1.0  ALP 108   38-126  AST 12   0-39  ALT 15   0-52  Lipid Panel w/reflex   2021-08-15    Cholesterol 126   <200  CHOL/HDL 3.0   2.0-4.0  HDLD 42   30-85  Triglyceride 129  0-199  NHDL 84   0-129  LDL Chol Calc (NIH) 61   0-99  LDL Chol Calc (NIH) 61   0-99    IMPRESSION:    ICD-10-CM   1. Shortness of breath  R06.02 EKG 12-Lead    2. Sleep apnea, obstructive  G47.33     3.  Diabetes mellitus without complication ([place])  M09.4     4. Class 3 severe obesity due to excess calories with serious comorbidity and body mass index (BMI) of 40.0 to 44.9 in adult [facility])  E66.01    Z68.41        RECOMMENDATIONS: Abigail Vega is a 46 y.o. female whose past medical history and cardiac risk factors include: migraines, depression, type 2 diabetes, Obesity due to excess calories, history of pneumonia since childhood.  Since November 2022 she has lost approximately 89 pounds due to lifestyle and medication changes.  She currently follows up at the weight loss clinic but still is motivated to lose weight.  She is no longer experiencing shortness of breath.  She has not required use of Bumex for lower extremity swelling since last office visit.  She is also requiring less BiPAP support given the progress that she is made during her weight loss journey.  Patient had fasting lipid and labs earlier this morning with PCP results are still pending.  These labs reflect her being off of Crestor.  A1c is still pending.  Patient is currently on spironolactone 50 mg p.o. daily.  I have requested that she discuss with her PCP continues to spironolactone 25 mg p.o. every morning and losartan 25 mg p.o. every afternoon given her history of diabetes in the past.  Will defer to PCP for now.  We will still recommend a goal LDL of less than equal to 70 mg/dL and if not achieved by dietary changes recommend pharmacological therapy in conjunction.  When she had her left and right heart catheterization back in August 2022 she was noted to have mild pulmonary hypertension likely due to [facility] group 3 and 2.  When she is done with her weight loss journey and/or reaches a plateau would recommend repeating a right heart catheterization to reevaluate her hemodynamics.  Patient is agreeable with the plan of care.  Would like to see her back in 1 year or sooner if change in clinical status.   FINAL MEDICATION  LIST END OF ENCOUNTER: No orders of the defined types were placed in this encounter.    Medications Discontinued During This Encounter  Medication Reason   rosuvastatin (CRESTOR) 10 MG tablet    norethindrone-ethinyl estradiol-iron (LOESTRIN FE) 1.5-30 MG-MCG tablet    ibuprofen (ADVIL) 200 MG tablet    cyanocobalamin 1000 MCG tablet    bumetanide (BUMEX) 1 MG tablet      Current Outpatient Medications:    albuterol (VENTOLIN HFA) 108 (90 Base) MCG/ACT inhaler, Inhale 2 puffs into the lungs every 6 (six) hours as needed for wheezing or shortness of breath., Disp: , Rfl:    Ascorbic Acid (VITAMIN C PO), Take 1,500 mg by mouth daily., Disp: , Rfl:    buPROPion (WELLBUTRIN XL) 150 MG 24 hr tablet, Take 150 mg by mouth in the morning and at bedtime., Disp: , Rfl:    cetirizine (ZYRTEC) 10 MG tablet, Take 10 mg by mouth daily., Disp: , Rfl:    [employer] TRIPLE MAGNESIUM COMPLEX PO, Take 1 tablet by mouth daily., Disp: , Rfl:    EPINEPHrine 0.3 mg/0.3 mL IJ  SOAJ injection, Inject 0.3 mg into the muscle as needed for anaphylaxis., Disp: , Rfl:    MAGNESIUM GLYCINATE PO, Take 500 mg by mouth daily., Disp: , Rfl:    metFORMIN (GLUCOPHAGE) 500 MG tablet, Take 1,000 mg by mouth daily with breakfast., Disp: , Rfl:    montelukast (SINGULAIR) 10 MG tablet, Take 10 mg by mouth every morning., Disp: , Rfl:    norethindrone-ethinyl estradiol-iron (BLISOVI FE 1.5/30) 1.5-30 MG-MCG tablet, Take 1 tablet by mouth daily., Disp: , Rfl:    Omega-3 Fatty Acids (FISH OIL) 1000 MG CAPS, Take 1,000 mg by mouth daily., Disp: , Rfl:    omeprazole (PRILOSEC) 40 MG capsule, Take 40 mg by mouth daily., Disp: , Rfl:    sertraline (ZOLOFT) 50 MG tablet, Take 50 mg by mouth daily., Disp: , Rfl:    spironolactone (ALDACTONE) 25 MG tablet, Take 25 mg by mouth 2 (two) times daily., Disp: , Rfl:    SUMAtriptan (IMITREX) 100 MG tablet, Take 100 mg by mouth every 2 (two) hours as needed for migraine. May repeat in 2 hours if headache  persists or recurs., Disp: , Rfl:    UNABLE TO FIND, See admin instructions. Med Name: allergy injection every 3 weeks, Disp: , Rfl:    VICTOZA 18 MG/3ML SOPN, Inject 1.8 mg into the skin daily., Disp: , Rfl:    MOUNJARO 7.5 MG/0.5ML Pen, Inject into the skin once a week. (Patient not taking: Reported on 02/18/2022), Disp: , Rfl:   Orders Placed This Encounter  Procedures   EKG 12-Lead     There are no Patient Instructions on file for this visit.   --Continue cardiac medications as reconciled in final medication list. --Return in about 1 year (around 02/19/2023) for Follow up, Dyspnea. Or sooner if needed. --Continue follow-up with your primary care physician regarding the management of your other chronic comorbid conditions.  Patient's questions and concerns were addressed to her satisfaction. She voices understanding of the instructions provided during this encounter.   This note was created using a voice recognition software as a result there may be grammatical errors inadvertently enclosed that do not reflect the nature of this encounter. Every attempt is made to correct such errors.  Rex Kras, [place], [facility]  Pager: [phone] Office: [phone]

## 2022-03-18 NOTE — Progress Notes (Signed)
Called and spoke with patient regarding lab results.

## 2022-06-12 NOTE — Progress Notes (Signed)
HPI F never smoker followed for Acute on Chronic Respiratory Failure with Hypoxia, OHS, Complicated by Asthma, Allergic Rhinitis, Morbid Obesity, DM2, Depression, WHO group 3PHTN,  Dr Maxwell Caul managing OSA- NPSG 01/15/21 AHI 86.3/ hr, Nocturnal Hypoxemia with mean O2 sat 70%. Dr Einar Gip did R/L Cath 8/9 Mild PHTN, mean 19, PA 46/16 O2 2 L sleep- ROTECH POC 3L exertion>> Inogen for concentrator and POC Walk test 12/14/20- - RA- 89%/ HR 96/ min. Desat after 1/4 lap to 86%, required 2L O2 to hold at 90% on 2 L.  ==================================================================================   08/09/21- 51 yoF never smoker followed for Acute on Chronic Respiratory Failure with edema/ pneumonia, Probable OHS, Complicated by Asthma, Allergic Rhinitis, Morbid Obesity, DM2, Depression, WHO group 3PHTN O2 2 L sleep- ROTECH POC 3L exertion>>01/26/21   Inogen 3L sleep and POC ACT score today 21 Dr Maxwell Caul managing OSA- NPSG 01/15/21 AHI 86.3/ hr, Nocturnal Hypoxemia with mean O2 sat 70%..  Dr Einar Gip did R/L Cath 01/02/21 Mild PHTN, mean 19, PA 46/16. His group follows cardiac. Allergist managing AR/ Asthma (singulair, ventolin hfa, zyrtec) Body weight today- 327 lbs Covid vax-3 Phizer Flu vax- ------Patient is doing good, no concerns. Wants to be off oxygen She has lost almost 75 pounds now and noted good her home O2 saturation is staying over 90% in the 3 or 4 hours between return home from work and bedtime.  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  She continues to sleep with her BiPAP machine with O2 3 L from concentrator.  Her goal is to lose a total of 100 pounds by the end of the school year.  12/10/21- 8 yoF never smoker followed for Acute on Chronic Hypoxic  Respiratory Failure with edema/ pneumonia, Probable OHS, Complicated by Asthma, Allergic Rhinitis, Morbid Obesity, DM2, Depression, WHO group 3PHTN, O2 2 L sleep- ROTECH POC 3L exertion>>01/26/21   Inogen 3L sleep and  POC [facility] Allergist/(Dr Orvil Feil)- Asthma, [facility], PHTN Body weight today-283 lbs No longer on O2- returned it all. Continues BIPAP/ [employer]- probably 25/25 but we are seeking download. Still managed by [facility]. 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 Back to teaching.  Breathing is comfortable w/o cough/wheeze.  06/13/22- 42 yoF never smoker followed for Acute on Chronic Hypoxic  Respiratory Failure with edema/ pneumonia, Probable OHS, Complicated by Asthma, Allergic Rhinitis, Morbid Obesity, DM2, Depression, WHO group 3PHTN, Allergist/(Dr Orvil Feil)- Asthma, AR>> Ventilin hfa, Singulair, Zyrtec,  [facility] >> PHTN Body weight today-288 lbs BIPAP now with Dr Redddy at [facility]. She dc'd and returned O2 last year.  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 Covid vax- 3 Phizer Flu vax- declines She is comfortable with her breathing now and feels with cardiac status managed by cardiology and her sleep apnea managed, that she could return to see [place] if needed.  ROS-see HPI   + = positive Constitutional:    +weight loss, night sweats, fevers, chills, +fatigue, lassitude. HEENT:    headaches, difficulty swallowing, tooth/dental problems, sore throat,       sneezing, itching, ear ache, nasal congestion, post nasal drip, snoring CV:    chest pain, orthopnea, PND, swelling in lower extremities, anasarca,           dizziness, palpitations Resp:   +shortness of breath with exertion or at rest.  productive cough,   non-productive cough, coughing up of blood.              change in color of mucus.  +wheezing.   Skin:    rash or lesions. GI:  No-   heartburn, indigestion, abdominal pain, nausea, vomiting, diarrhea,                 change in bowel habits, loss of appetite GU: dysuria, change in color of urine, no urgency or frequency.   flank pain. MS:   joint pain, stiffness, decreased range of motion, back pain. Neuro-      nothing unusual Psych:  change in mood or affect.  depression or anxiety.   memory loss.  OBJ- Physical Exam General- Alert, Oriented, Affect-appropriate, Distress- none acute, + still morbidly obese.  Skin- rash-none, lesions- none, excoriation- none Lymphadenopathy- none Head- atraumatic            Eyes- Gross vision intact, PERRLA, conjunctivae and secretions clear            Ears- Hearing, canals-normal            Nose- Clear, no-Septal dev, mucus, polyps, erosion, perforation             Throat- Mallampati IV, mucosa clear , drainage- none, tonsils-absent Neck- flexible , trachea midline, no stridor , thyroid nl, carotid no bruit Chest - symmetrical excursion , unlabored           Heart/CV- RRR , no murmur , no gallop  , no rub, nl s1 s2                           - JVD- none , edema+3, stasis changes+,  varices- none           Lung- clear to P&A, wheeze- none, cough- none , dullness-none, rub- none           Chest wall-  Abd-  Br/ Gen/ Rectal- Not done, not indicated Extrem- cyanosis- none, clubbing, none, atrophy- none, strength- nl Neuro- grossly intact to observation

## 2022-06-13 ENCOUNTER — Ambulatory Visit: Payer: BC Managed Care – PPO | Admitting: Internal Medicine

## 2022-06-13 ENCOUNTER — Encounter: Payer: Self-pay | Admitting: Internal Medicine

## 2022-06-13 VITALS — BP 124/66 | HR 80 | Ht 68.0 in | Wt 288.8 lb

## 2022-06-13 DIAGNOSIS — Z6841 Body Mass Index (BMI) 40.0 and over, adult: Secondary | ICD-10-CM | POA: Diagnosis not present

## 2022-06-13 DIAGNOSIS — I2729 Other secondary pulmonary hypertension: Secondary | ICD-10-CM | POA: Diagnosis not present

## 2022-06-13 NOTE — Patient Instructions (Signed)
Glad you are doing better. Please call if we can help

## 2022-07-17 ENCOUNTER — Encounter: Payer: Self-pay | Admitting: Internal Medicine

## 2022-07-17 NOTE — Assessment & Plan Note (Signed)
Primary problem is obesity hypoventilation.  BiPAP helps with this at night.  She is no longer using oxygen and is followed by cardiology.

## 2022-07-17 NOTE — Assessment & Plan Note (Signed)
Encouraged to continue long-term effort to normalize body weight.

## 2022-08-06 ENCOUNTER — Encounter (HOSPITAL_BASED_OUTPATIENT_CLINIC_OR_DEPARTMENT_OTHER): Payer: Self-pay

## 2022-08-06 ENCOUNTER — Other Ambulatory Visit: Payer: Self-pay

## 2022-08-06 ENCOUNTER — Emergency Department (HOSPITAL_BASED_OUTPATIENT_CLINIC_OR_DEPARTMENT_OTHER): Payer: BC Managed Care – PPO

## 2022-08-06 ENCOUNTER — Emergency Department (HOSPITAL_BASED_OUTPATIENT_CLINIC_OR_DEPARTMENT_OTHER)
Admission: EM | Admit: 2022-08-06 | Discharge: 2022-08-06 | Disposition: A | Discharge status: Home / Self Care | Payer: BC Managed Care – PPO | Attending: Emergency Medicine | Admitting: Emergency Medicine

## 2022-08-06 DIAGNOSIS — I10 Essential (primary) hypertension: Secondary | ICD-10-CM | POA: Insufficient documentation

## 2022-08-06 DIAGNOSIS — R112 Nausea with vomiting, unspecified: Secondary | ICD-10-CM | POA: Diagnosis present

## 2022-08-06 DIAGNOSIS — K529 Noninfective gastroenteritis and colitis, unspecified: Secondary | ICD-10-CM | POA: Insufficient documentation

## 2022-08-06 DIAGNOSIS — E860 Dehydration: Secondary | ICD-10-CM | POA: Insufficient documentation

## 2022-08-06 DIAGNOSIS — N179 Acute kidney failure, unspecified: Secondary | ICD-10-CM | POA: Insufficient documentation

## 2022-08-06 LAB — CBC WITH DIFFERENTIAL/PLATELET
Abs Immature Granulocytes: 0.12 10*3/uL — ABNORMAL HIGH (ref 0.00–0.07)
Basophils Absolute: 0.1 10*3/uL (ref 0.0–0.1)
Basophils Relative: 0 %
Eosinophils Absolute: 0.3 10*3/uL (ref 0.0–0.5)
Eosinophils Relative: 1 %
HCT: 50 % — ABNORMAL HIGH (ref 36.0–46.0)
Hemoglobin: 16.4 g/dL — ABNORMAL HIGH (ref 12.0–15.0)
Immature Granulocytes: 1 %
Lymphocytes Relative: 11 %
Lymphs Abs: 2.5 10*3/uL (ref 0.7–4.0)
MCH: 30 pg (ref 26.0–34.0)
MCHC: 32.8 g/dL (ref 30.0–36.0)
MCV: 91.6 fL (ref 80.0–100.0)
Monocytes Absolute: 1.2 10*3/uL — ABNORMAL HIGH (ref 0.1–1.0)
Monocytes Relative: 5 %
Neutro Abs: 18.5 10*3/uL — ABNORMAL HIGH (ref 1.7–7.7)
Neutrophils Relative %: 82 %
Platelets: 402 10*3/uL — ABNORMAL HIGH (ref 150–400)
RBC: 5.46 MIL/uL — ABNORMAL HIGH (ref 3.87–5.11)
RDW: 13.4 % (ref 11.5–15.5)
WBC: 22.6 10*3/uL — ABNORMAL HIGH (ref 4.0–10.5)
nRBC: 0 % (ref 0.0–0.2)

## 2022-08-06 LAB — COMPREHENSIVE METABOLIC PANEL
ALT: 20 U/L (ref 0–44)
AST: 22 U/L (ref 15–41)
Albumin: 4 g/dL (ref 3.5–5.0)
Alkaline Phosphatase: 70 U/L (ref 38–126)
Anion gap: 11 (ref 5–15)
BUN: 17 mg/dL (ref 6–20)
CO2: 23 mmol/L (ref 22–32)
Calcium: 9.5 mg/dL (ref 8.9–10.3)
Chloride: 101 mmol/L (ref 98–111)
Creatinine, Ser: 1.22 mg/dL — ABNORMAL HIGH (ref 0.44–1.00)
GFR, Estimated: 55 mL/min — ABNORMAL LOW (ref >60)
Glucose, Bld: 168 mg/dL — ABNORMAL HIGH (ref 70–99)
Potassium: 3.7 mmol/L (ref 3.5–5.1)
Sodium: 135 mmol/L (ref 135–145)
Total Bilirubin: 0.6 mg/dL (ref 0.3–1.2)
Total Protein: 8.8 g/dL — ABNORMAL HIGH (ref 6.5–8.1)

## 2022-08-06 LAB — URINALYSIS, ROUTINE W REFLEX MICROSCOPIC
Bilirubin Urine: NEGATIVE
Glucose, UA: NEGATIVE mg/dL
Hgb urine dipstick: NEGATIVE
Ketones, ur: NEGATIVE mg/dL
Leukocytes,Ua: NEGATIVE
Nitrite: NEGATIVE
Protein, ur: NEGATIVE mg/dL
Specific Gravity, Urine: <=1.005 (ref 1.005–1.030)
pH: 5.5 (ref 5.0–8.0)

## 2022-08-06 LAB — LACTIC ACID, PLASMA
Lactic Acid, Venous: 2.5 mmol/L (ref 0.5–1.9)
Lactic Acid, Venous: 2.4 mmol/L (ref 0.5–1.9)
Lactic Acid, Venous: 1.7 mmol/L (ref 0.5–1.9)

## 2022-08-06 LAB — LIPASE, BLOOD: Lipase: 31 U/L (ref 11–51)

## 2022-08-06 LAB — PREGNANCY, URINE: Preg Test, Ur: NEGATIVE

## 2022-08-06 MED ORDER — ONDANSETRON HCL 4 MG/2ML IJ SOLN
4.0000 mg | Freq: Once | INTRAMUSCULAR | Status: AC | PRN
  Administered 2022-08-06: 4 mg via INTRAVENOUS
  Filled 2022-08-06: qty 2

## 2022-08-06 MED ORDER — LACTATED RINGERS IV BOLUS
1000.0000 mL | Freq: Once | INTRAVENOUS | Status: AC
  Administered 2022-08-06: 1000 mL via INTRAVENOUS

## 2022-08-06 MED ORDER — PROCHLORPERAZINE EDISYLATE 10 MG/2ML IJ SOLN
10.0000 mg | Freq: Once | INTRAMUSCULAR | Status: AC
  Administered 2022-08-06: 10 mg via INTRAVENOUS
  Filled 2022-08-06: qty 2

## 2022-08-06 MED ORDER — IOHEXOL 300 MG/ML  SOLN
100.0000 mL | Freq: Once | INTRAMUSCULAR | Status: AC | PRN
  Administered 2022-08-06: 100 mL via INTRAVENOUS

## 2022-08-06 MED ORDER — ONDANSETRON 4 MG PO TBDP: 4.0000 mg | ORAL_TABLET | Freq: Three times a day (TID) | ORAL | 0 refills | Status: DC | PRN

## 2022-08-06 NOTE — ED Notes (Signed)
Blood culture number one drawn via 23 gauge butterfly from R ac, blood culture number two drawn from L hand, another nurse had attempted and was unsuccessful.  Blood draw for blood culture number two came out slowly and minimum amounts obtained.  Samples takes to lab, pt tolerated well.

## 2022-08-06 NOTE — Discharge Instructions (Addendum)
Please follow-up with your primary care physician for recheck of your kidney function.  You are offered admission for observation versus discharge and close follow-up and you preferred close follow-up.  Also please follow-up to provide a stool sample for C. difficile testing.  Return to the emergency department for any severe abdominal pain, uncontrolled nausea and vomiting and inability to tolerate oral intake.

## 2022-08-06 NOTE — ED Triage Notes (Signed)
Pt coming in for N/V/D starting at 0400. Pt actively vomiting in triage (all bile). Pt reports 6-8 episodes diarrhea since 0400. Pt reports that she doesn't get fevers, "my temp goes low instead". Pt denies SOB, fatigue. Pt reports feeling weak. 95.37F oral. Temp.

## 2022-08-06 NOTE — ED Provider Notes (Signed)
[facility] Provider Note   CSN: [id number] Arrival date & time: 08/06/22  [id number]     History  Chief Complaint  Patient presents with   Vomiting    Abigail Vega is a 47 y.o. female.  HPI   47 year old female with medical history significant for depression, obesity, HTN, HLD, on Ozempic for weight loss who presents to the emergency department with nausea, vomiting, diarrhea.  The patient states that she woke up this morning around 3 AM.  She developed some abdominal cramping and then had about 6-8 episodes of watery diarrhea since then.  She has had several episodes of emesis that initially was nonbilious and subsequently has become consistent with dark green bile.  She has been feeling chills.  She denies any shortness of breath, chest pain.  She currently does not feel any abdominal pain.  She feels weak and fatigued.  She had been recently on an antibiotic long-term due to concern for persistent cough.  Home Medications Prior to Admission medications   Medication Sig Start Date End Date Taking? Authorizing Provider  ondansetron (ZOFRAN-ODT) 4 MG disintegrating tablet Take 1 tablet (4 mg total) by mouth every 8 (eight) hours as needed. 08/06/22  Yes Regan Lemming, MD  albuterol (VENTOLIN HFA) 108 (90 Base) MCG/ACT inhaler Inhale 2 puffs into the lungs every 6 (six) hours as needed for wheezing or shortness of breath.    [provider]  Ascorbic Acid (VITAMIN C PO) Take 1,500 mg by mouth daily.    [provider]  buPROPion (WELLBUTRIN XL) 150 MG 24 hr tablet Take 150 mg by mouth in the morning and at bedtime.    [provider]  cetirizine (ZYRTEC) 10 MG tablet Take 10 mg by mouth daily.    [provider]  EPINEPHrine 0.3 mg/0.3 mL IJ SOAJ injection Inject 0.3 mg into the muscle as needed for anaphylaxis.    [provider]  metFORMIN (GLUCOPHAGE) 500 MG tablet Take 1,000 mg by mouth daily with  breakfast.    [provider]  montelukast (SINGULAIR) 10 MG tablet Take 10 mg by mouth every morning.    [provider]  norethindrone-ethinyl estradiol-iron (BLISOVI FE 1.5/30) 1.5-30 MG-MCG tablet Take 1 tablet by mouth daily.    [provider]  Omega-3 Fatty Acids (FISH OIL) 1000 MG CAPS Take 1,000 mg by mouth daily.    [provider]  OZEMPIC, 2 MG/DOSE, 8 MG/3ML SOPN Inject 2 mg into the skin once a week. 05/02/22   [provider]  sertraline (ZOLOFT) 25 MG tablet  06/12/22   [provider]  SUMAtriptan (IMITREX) 100 MG tablet Take 100 mg by mouth every 2 (two) hours as needed for migraine. May repeat in 2 hours if headache persists or recurs.    [provider]  UNABLE TO FIND See admin instructions. Med Name: allergy injection every 3 weeks    [provider]      Allergies    Other    Review of Systems   Review of Systems  Constitutional:  Positive for chills and fatigue.  Gastrointestinal:  Positive for abdominal pain, diarrhea, nausea and vomiting.  All other systems reviewed and are negative.   Physical Exam Updated Vital Signs BP 108/62 (BP Location: Right Arm)   Pulse 80   Temp 97.6 F (36.4 C) (Oral)   Resp 17   Ht '5\' 8"'$  (1.727 m)   Wt 131.5 kg  LMP 07/10/2022 (Approximate)   SpO2 95%   BMI 44.09 kg/m  Physical Exam Vitals and nursing note reviewed.  Constitutional:      General: She is not in acute distress.    Appearance: She is well-developed.  HENT:     Head: Normocephalic and atraumatic.  Eyes:     Conjunctiva/sclera: Conjunctivae normal.  Cardiovascular:     Rate and Rhythm: Regular rhythm. Tachycardia present.  Pulmonary:     Effort: Pulmonary effort is normal. No respiratory distress.     Breath sounds: Normal breath sounds.  Abdominal:     General: There is no distension.     Palpations: Abdomen is soft.     Tenderness: There is no abdominal tenderness. There is no  guarding or rebound.  Musculoskeletal:        General: No swelling.     Cervical back: Neck supple.  Skin:    General: Skin is warm and dry.     Capillary Refill: Capillary refill takes less than 2 seconds.  Neurological:     Mental Status: She is alert.  Psychiatric:        Mood and Affect: Mood normal.     ED Results / Procedures / Treatments   Labs (all labs ordered are listed, but only abnormal results are displayed) Labs Reviewed  LACTIC ACID, PLASMA - Abnormal; Notable for the following components:      Result Value   Lactic Acid, Venous 2.5 (*)    All other components within normal limits  LACTIC ACID, PLASMA - Abnormal; Notable for the following components:   Lactic Acid, Venous 2.4 (*)    All other components within normal limits  COMPREHENSIVE METABOLIC PANEL - Abnormal; Notable for the following components:   Glucose, Bld 168 (*)    Creatinine, Ser 1.22 (*)    Total Protein 8.8 (*)    GFR, Estimated 55 (*)    All other components within normal limits  CBC WITH DIFFERENTIAL/PLATELET - Abnormal; Notable for the following components:   WBC 22.6 (*)    RBC 5.46 (*)    Hemoglobin 16.4 (*)    HCT 50.0 (*)    Platelets 402 (*)    Neutro Abs 18.5 (*)    Monocytes Absolute 1.2 (*)    Abs Immature Granulocytes 0.12 (*)    All other components within normal limits  CULTURE, BLOOD (ROUTINE X 2)  C DIFFICILE QUICK SCREEN W PCR REFLEX    GASTROINTESTINAL PANEL BY PCR, STOOL (REPLACES STOOL CULTURE)  CULTURE, BLOOD (ROUTINE X 2)  URINALYSIS, ROUTINE W REFLEX MICROSCOPIC  PREGNANCY, URINE  LIPASE, BLOOD  LACTIC ACID, PLASMA  LACTIC ACID, PLASMA    EKG None  Radiology CT ABDOMEN PELVIS W CONTRAST  Result Date: 08/06/2022 CLINICAL DATA:  Abdominal pain, nausea, and vomiting, and diarrhea since 0400 hours, history asthma and hypertension EXAM: CT ABDOMEN AND PELVIS WITH CONTRAST TECHNIQUE: Multidetector CT imaging of the abdomen and pelvis was performed using the  standard protocol following bolus administration of intravenous contrast. RADIATION DOSE REDUCTION: This exam was performed according to the departmental dose-optimization program which includes automated exposure control, adjustment of the mA and/or kV according to patient size and/or use of iterative reconstruction technique. CONTRAST:  151m OMNIPAQUE IOHEXOL 300 MG/ML SOLN IV. No oral contrast. COMPARISON:  None Available. FINDINGS: Lower chest: Atelectasis versus scarring at base of RIGHT middle lobe. Hepatobiliary: Gallbladder and liver normal appearance Pancreas: Normal appearance Spleen: Normal appearance.  Small adjacent splenule. Adrenals/Urinary Tract: Adrenal  glands normal appearance. Tiny nonobstructing LEFT renal calculus. Kidneys, ureters, and decompressed bladder otherwise normal appearance. Stomach/Bowel: Normal appendix. Stomach and bowel loops normal appearance Vascular/Lymphatic: Vascular structures patent. Aorta normal caliber. No adenopathy. Reproductive: Uterus and ovaries unremarkable Other: No free air or free fluid. No hernia or inflammatory process. Musculoskeletal: No acute osseous findings. IMPRESSION: Tiny nonobstructing LEFT renal calculus. No acute intra-abdominal or intrapelvic abnormalities. Electronically Signed   By: Lavonia Dana M.D.   On: 08/06/2022 08:34   DG Chest 2 View  Result Date: 08/06/2022 CLINICAL DATA:  Nausea vomiting and diarrhea. EXAM: CHEST - 2 VIEW COMPARISON:  12/14/2020 FINDINGS: The heart size and mediastinal contours are within normal limits. Both lungs are clear. The visualized skeletal structures are unremarkable. IMPRESSION: No active cardiopulmonary disease. Electronically Signed   By: Kerby Moors M.D.   On: 08/06/2022 07:26    Procedures Procedures    Medications Ordered in ED Medications  ondansetron (ZOFRAN) injection 4 mg (4 mg Intravenous Given by Other 08/06/22 0751)  lactated ringers bolus 1,000 mL (0 mLs Intravenous Stopped 08/06/22  0855)  iohexol (OMNIPAQUE) 300 MG/ML solution 100 mL (100 mLs Intravenous Contrast Given 08/06/22 0811)  prochlorperazine (COMPAZINE) injection 10 mg (10 mg Intravenous Given by Other 08/06/22 0845)  lactated ringers bolus 1,000 mL (0 mLs Intravenous Stopped 08/06/22 1113)    ED Course/ Medical Decision Making/ A&P Clinical Course as of 08/06/22 1636  Tue Aug 06, 2022  0823 Lactic Acid, Venous(!!): 2.5 [JL]  0823 Creatinine(!): 1.22 [JL]  0823 WBC(!): 22.6 [JL]    Clinical Course User Index [JL] Regan Lemming, MD                             Medical Decision Making Amount and/or Complexity of Data Reviewed Labs: ordered. Decision-making details documented in ED Course. Radiology: ordered.  Risk Prescription drug management.    47 year old female with medical history significant for depression, obesity, HTN, HLD, on Ozempic for weight loss who presents to the emergency department with nausea, vomiting, diarrhea.  The patient states that she woke up this morning around 3 AM.  She developed some abdominal cramping and then had about 6-8 episodes of watery diarrhea since then.  She has had several episodes of emesis that initially was nonbilious and subsequently has become consistent with dark green bile.  She has been feeling chills.  She denies any shortness of breath, chest pain.  She currently does not feel any abdominal pain.  She feels weak and fatigued.  She had been recently on an antibiotic long-term due to concern for persistent cough.  On arrival, the patient was hypothermic, temperature 95.4, tachycardic heart rate 106, BP 111/81, saturating 99% on room air.  Physical exam significant for an abdomen that was soft, nontender, nondistended, no rebound or guarding, negative Murphy sign.  Patient having episodes of bilious emesis with associated watery diarrhea.  Some abdominal cramping earlier this morning.  Had recently been on an antibiotic but denies any recent steroid use.  She  states that she is currently on Ozempic.   Differential diagnosis includes gastroenteritis, bowel obstruction, less likely pancreatitis, cholelithiasis/cholecystitis, considered other intra-abdominal infection, considered C. difficile infection, other infectious etiology of her diarrhea with colitis.  The patient was unable to provide a stool sample in the emergency department.  Laboratory evaluation significant for leukocytosis to 22.6, evidence of hemoconcentration with a hemoglobin of 16.4, hematocrit of 50, CMP with evidence of an AKI  with a serum creatinine of 1.22 from baseline of 0.8, mild hyperglycemia to 168, no evidence of DKA, normal electrolytes, normal liver function.  Urinalysis without evidence of UTI, lactic acid initially elevated to 2.5, normalized to 1.7 after fluid resuscitation, blood cultures x 2 were collected and pending.  Ordered C. difficile and GIP testing but the patient could not provide a stool sample.  CT of the abdomen pelvis was performed and was negative for acute pathology, chest x-ray was negative. IMPRESSION:  Tiny nonobstructing LEFT renal calculus.    No acute intra-abdominal or intrapelvic abnormalities.   Patient's symptoms are consistent with likely gastroenteritis, considered side effect of Ozempic however patient with nausea vomiting diarrhea.  Well-appearing on repeat assessment, tolerating oral intake and able to orally rehydrate.  Considered admission for observation in the setting of dehydration however the patient preferred to be discharged home and continue to orally rehydrate at home.  I advised the patient follow-up in the next few days for recheck of her renal function given her mild AKI.  Zofran was prescribed for ongoing nausea.  I advised that the patient discuss her use of Ozempic with her PCP.  Stable for discharge.   Final Clinical Impression(s) / ED Diagnoses Final diagnoses:  Gastroenteritis  AKI (acute kidney injury) ([place])  Dehydration     Rx / DC Orders ED Discharge Orders          Ordered    ondansetron (ZOFRAN-ODT) 4 MG disintegrating tablet  Every 8 hours PRN        08/06/22 1327              Regan Lemming, MD 08/06/22 1636

## 2022-08-06 NOTE — ED Notes (Signed)
Iv will not draw, blood drawn via iv start in R ac

## 2022-08-06 NOTE — ED Notes (Signed)
Supplied pt with specimen cups for C diff testing , she sts will drop it off tomorrow or when she passes BM

## 2022-08-11 LAB — CULTURE, BLOOD (ROUTINE X 2)
Culture: NO GROWTH
Culture: NO GROWTH
Special Requests: ADEQUATE

## 2022-08-14 LAB — GASTROINTESTINAL PANEL BY PCR, STOOL (REPLACES STOOL CULTURE)
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
## 2022-12-23 ENCOUNTER — Other Ambulatory Visit: Payer: Self-pay | Admitting: Family Medicine

## 2022-12-23 DIAGNOSIS — Z1231 Encounter for screening mammogram for malignant neoplasm of breast: Secondary | ICD-10-CM

## 2022-12-27 ENCOUNTER — Ambulatory Visit: Payer: BC Managed Care – PPO | Admitting: Cardiology

## 2022-12-27 ENCOUNTER — Encounter: Payer: Self-pay | Admitting: Cardiology

## 2022-12-27 VITALS — BP 120/72 | HR 82 | Resp 16 | Ht 68.0 in | Wt 299.2 lb

## 2022-12-27 DIAGNOSIS — G4733 Obstructive sleep apnea (adult) (pediatric): Secondary | ICD-10-CM

## 2022-12-27 DIAGNOSIS — E66813 Obesity, class 3: Secondary | ICD-10-CM

## 2022-12-27 DIAGNOSIS — R0602 Shortness of breath: Secondary | ICD-10-CM

## 2022-12-27 DIAGNOSIS — E119 Type 2 diabetes mellitus without complications: Secondary | ICD-10-CM

## 2022-12-27 NOTE — Progress Notes (Signed)
Date:  12/27/2022   ID:  Abigail Vega, DOB 01/26/1976, MRN [id number]  PCP:  Vernon Prey, NP  Cardiologist:  Tessa Lerner, DO, [facility]  (established care 12/18/2020)  Date: 12/27/22 Last Office Visit: 02/18/2022  Chief Complaint  Patient presents with   Shortness of Breath   Follow-up    HPI  Abigail Vega is a 47 y.o. 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  Referred to the practice for evaluation of dyspnea back in 2022.  She underwent extensive cardiovascular testing including left and right heart catheterization.  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  Since her heart catheterization she is implemented lifestyle changes and has significantly lost weight.  She now presents today for 1 year follow-up visit.  Over the last 1 year she denies anginal chest pain or heart failure symptoms.  The dyspnea on exertion that she was experiencing in the past is no longer present.  She has gained some weight since last office visit but due to insurance reasons she was not able to be on Mounjaro consistently (off of it for almost 11 months).  But overall she has lost still a significant amount of weight when compared to 2022 (when she had her heart cath).  FUNCTIONAL STATUS: No structured exercise program or daily routine.  ALLERGIES: Allergies  Allergen Reactions   Other Cough    Tree, molds, cat, dogs , mites Reports multiple "environmental" allergies    MEDICATION LIST PRIOR TO VISIT: Current Meds  Medication Sig   albuterol (VENTOLIN HFA) 108 (90 Base) MCG/ACT inhaler Inhale 2 puffs into the lungs every 6 (six) hours as needed for wheezing or shortness of breath.    Ascorbic Acid (VITAMIN C PO) Take 1,500 mg by mouth daily.   buPROPion (WELLBUTRIN XL) 150 MG 24 hr tablet Take 150 mg by mouth in the morning and at bedtime.   cetirizine (ZYRTEC) 10 MG tablet Take 10 mg by mouth daily.   EPINEPHrine 0.3 mg/0.3 mL IJ SOAJ injection Inject 0.3 mg into the muscle as needed for anaphylaxis.   losartan (COZAAR) 25 MG tablet Take 1 tablet by mouth daily.   metFORMIN (GLUCOPHAGE) 500 MG tablet Take 1,000 mg by mouth daily with breakfast.   montelukast (SINGULAIR) 10 MG tablet Take 10 mg by mouth every morning.   MOUNJARO 7.5 MG/0.5ML Pen Inject 7.5 mg into the skin once a week.   norethindrone-ethinyl estradiol-iron (BLISOVI FE 1.5/30) 1.5-30 MG-MCG tablet Take 1 tablet by mouth daily.   Omega-3 Fatty Acids (FISH OIL) 1000 MG CAPS Take 1,000 mg by mouth daily.   ondansetron (ZOFRAN-ODT) 4 MG disintegrating tablet Take 1 tablet (4 mg total) by mouth every 8 (eight) hours as needed.   rosuvastatin (CRESTOR) 5 MG tablet Take 5 mg by mouth daily.   sertraline (ZOLOFT) 25 MG tablet    spironolactone (ALDACTONE) 25 MG tablet Take 25 mg by mouth daily.   SUMAtriptan (IMITREX) 100 MG tablet Take 100 mg by mouth every 2 (two) hours as needed for migraine. May repeat in 2 hours if headache persists or recurs.   UNABLE TO FIND See admin instructions. Med Name: allergy injection every 3 weeks     PAST MEDICAL  HISTORY: Past Medical History:  Diagnosis Date   Asthma    Depression    Hyperlipidemia    Hypertension    Seasonal allergies     PAST SURGICAL HISTORY: Past Surgical History:  Procedure Laterality Date   COLONOSCOPY WITH PROPOFOL N/A 12/01/2020   Procedure: COLONOSCOPY WITH PROPOFOL;  Surgeon: Vida Rigger, MD;  Location: [facility];  Service: Endoscopy;  Laterality: N/A;   NASAL SEPTOPLASTY W/ TURBINOPLASTY  1991   RIGHT/LEFT HEART CATH AND CORONARY ANGIOGRAPHY N/A 01/02/2021   Procedure: RIGHT/LEFT HEART CATH AND CORONARY ANGIOGRAPHY;  Surgeon: Yates Decamp, MD;   Location: [facility];  Service: Cardiovascular;  Laterality: N/A;   TONSILECTOMY, ADENOIDECTOMY, BILATERAL MYRINGOTOMY AND TUBES  1981   TYMPANOPLASTY  1986    FAMILY HISTORY: Mom and dad are both alive.  No significant cardiac history.  Patient denies any premature CAD or sudden cardiac death in the family.  SOCIAL HISTORY:  The patient  reports that she has never smoked. She has never used smokeless tobacco. She reports that she does not drink alcohol and does not use drugs.  REVIEW OF SYSTEMS: Review of Systems  Constitutional: Positive for weight gain.  Cardiovascular:  Negative for chest pain, claudication, dyspnea on exertion, irregular heartbeat, leg swelling, near-syncope, orthopnea, palpitations, paroxysmal nocturnal dyspnea and syncope.  Respiratory:  Negative for shortness of breath.   Hematologic/Lymphatic: Negative for bleeding problem.  Musculoskeletal:  Negative for muscle cramps and myalgias.  Neurological:  Negative for dizziness and light-headedness.    PHYSICAL EXAM:    12/27/2022   10:34 AM 08/06/2022   10:46 AM 08/06/2022    7:53 AM  Vitals with BMI  Height 5\' 8"     Weight 299 lbs 3 oz    BMI 45.5    Systolic 120 108 161  Diastolic 72 62 71  Pulse 82 80 89    Physical Exam  Constitutional: No distress.  Age appropriate, hemodynamically stable.   Neck: No JVD (Unable to evaluate due to short neck stature and adipose tissue) present.  Cardiovascular: Normal rate, regular rhythm, S1 normal, S2 normal, intact distal pulses and normal pulses. Exam reveals no gallop, no S3 and no S4.  No murmur heard. Pulmonary/Chest: Effort normal and breath sounds normal. No stridor. She has no wheezes. She has no rales.  Abdominal: Soft. Bowel sounds are normal. She exhibits no distension. There is no abdominal tenderness.  Abdominal obesity.  Musculoskeletal:        General: No edema.     Cervical back: Neck supple.  Neurological: She is alert and oriented to  person, place, and time. She has intact cranial nerves (2-12).  Skin: Skin is warm and moist.   CARDIAC DATABASE: EKG: December 27, 2022: Sinus rhythm, 72 bpm, without underlying ischemia or injury pattern.  Echocardiogram: 01/04/2021:  Left ventricle cavity is normal in size and wall thickness. Normal global wall motion. Normal LV systolic function with EF 63%. Normal diastolic filling pattern.  Mild (Grade I) mitral regurgitation.  Trace tricuspid regurgitation.  IVC is not seen to estimate PASP.    Stress Testing: No results found for this or any previous visit from the past 1095 days.  Heart Catheterization: Right and left heart catheterization 01/02/2021: LV 120/11, EDP 24 mmHg.  Ao 114/74, mean 89 mmHg.  No pressure gradient across the aortic valve.  Markedly elevated EDP.  LVEF 55 to 60%. Left main: Large vessel, normal. LAD: Large vessel.  Gives origin to 2 large D1 and D2.  Smooth and normal. Ramus intermediate: Moderate to large vessel, normal. CX: Gives origin to large OM1 and OM 2 and continues in a small AV groove branch.  Smooth and normal. RCA: Dominant.  Smooth and normal.  RA: 16/14, mean 13 mmHg.  RV 44/5, EDP 15 mmHg.  PA 46/16, mean 31 mmHg.  PA saturation 69%.  PW 22/21, mean 19 mmHg.  Impression: Normal coronary arteries.  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  Recommendation: Weight loss, gentle diuresis.   LABORATORY DATA:    Latest Ref Rng & Units 08/06/2022    7:08 AM 01/02/2021    1:43 PM 01/02/2021    1:35 PM  CBC  WBC 4.0 - 10.5 K/uL 22.6     Hemoglobin 12.0 - 15.0 g/dL 16.1  09.6  04.5    40.9   Hematocrit 36.0 - 46.0 % 50.0  43.0  43.0    43.0   Platelets 150 - 400 K/uL 402          Latest Ref Rng & Units 08/06/2022    7:08 AM 02/22/2021    2:38 PM 01/02/2021    1:43 PM  CMP  Glucose 70 - 99 mg/dL 811  914    BUN 6 - 20 mg/dL 17  12    Creatinine 7.82 - 1.00 mg/dL 9.56  2.13    Sodium 086 - 145 mmol/L 135   142  141   Potassium 3.5 - 5.1 mmol/L 3.7  4.1  4.2   Chloride 98 - 111 mmol/L 101  95    CO2 22 - 32 mmol/L 23  29    Calcium 8.9 - 10.3 mg/dL 9.5  9.1    Total Protein 6.5 - 8.1 g/dL 8.8     Total Bilirubin 0.3 - 1.2 mg/dL 0.6     Alkaline Phos 38 - 126 U/L 70     AST 15 - 41 U/L 22     ALT 0 - 44 U/L 20       Lipid Panel  No results found for: "CHOL", "TRIG", "HDL", "CHOLHDL", "VLDL", "LDLCALC", "LDLDIRECT", "LABVLDL"  No components found for: "NTPROBNP" No results for input(s): "PROBNP" in the last 8760 hours.  No results for input(s): "TSH" in the last 8760 hours.  BMP Recent Labs    08/06/22 0708  NA 135  K 3.7  CL 101  CO2 23  GLUCOSE 168*  BUN 17  CREATININE 1.22*  CALCIUM 9.5  GFRNONAA 55*    HEMOGLOBIN A1C Lab Results  Component Value Date   HGBA1C 6.5 (H) 12/02/2020   MPG 139.85 12/02/2020   External Labs: Collected: 08/03/2020 Creatinine 0.79 mg/dL. eGFR: 78 mL/min per 1.73 m Potassium 4.4 AST 10, ALT 11, alkaline phosphatase 82 (all within normal limits) Total cholesterol 182, triglycerides 125, HDL 43, LDL 116 Hemoglobin A1c: 6.1  External Labs:  Name Result Date Reference Range  Hemoglobin A1c   2021-08-15    eAG 105      Hgb A1c 5.3   4.8-5.6  Comp Metabolic Panel   5784-69-62    Glucose 86   70-99  BUN 14   6-26  Creatinine 0.81   0.60-1.30  eGFR2021 90   >60  Sodium 140   136-145  Potassium 4.1   3.5-5.5  Chloride 101   98-107  CO2 31   22-32  Anion Gap 11.9   6.0-20.0  Calcium 9.6   8.6-10.3  CA-corrected 9.37   8.60-10.30  Protein, Total  7.5   6.0-8.3  Albumin 4.2   3.4-4.8  TBIL 0.6   0.3-1.0  ALP 108   38-126  AST 12   0-39  ALT 15   0-52  Lipid Panel w/reflex   2021-08-15    Cholesterol 126   <200  CHOL/HDL 3.0   2.0-4.0  HDLD 42   30-85  Triglyceride 129   0-199  NHDL 84   0-129  LDL Chol Calc (NIH) 61   0-99  LDL Chol Calc (NIH) 61   0-99    IMPRESSION:    ICD-10-CM   1. Shortness of breath  R06.02 EKG  12-Lead    2. Sleep apnea, obstructive  G47.33     3. Diabetes mellitus without complication (HCC)  E11.9     4. Class 3 severe obesity due to excess calories with serious comorbidity and body mass index (BMI) of 40.0 to 44.9 in adult [facility])  E66.01    Z68.41        RECOMMENDATIONS: Abigail Vega is a 47 y.o. female whose past medical history and cardiac risk factors include: migraines, depression, type 2 diabetes, Obesity due to excess calories, history of pneumonia since childhood.  Patient presents today for 1 year follow-up visit as she was initially referred for evaluation for heart disease given her shortness of breath.  Since establishing care she has undergone extensive cardiovascular as outlined above.  During the workup she underwent left and right heart catheterization in 2022.  She did not have epicardial coronary artery disease per report.  However right heart catheterization noted mean PA pressure of 31 mmHg, mean wedge of 19 mmHg, and her LVEDP was 23 mmHg.  Her mild pulmonary hypertension likely secondary WHO group 2 and 3.  At that time she was recommended the importance of weight loss and diuresis.  Since then she has implemented lifestyle changes and also been on [facility] collectively has lost more than 100 pounds.  She denies anginal chest pain or shortness of breath.  The original plan was once her shortness of breath is completely resolved we will repeat right heart catheterization to reevaluate her pressures.  However currently she is asymptomatic and would like to hold off right heart catheterization due to cost.  This is reasonable as clinically she is improving.  Given her comorbidities of she is currently on ARB, statin therapy, and metformin.  Consider SGLT2 inhibitors to help facilitate better glycemic control if needed.  Patient will soon restart [facility] which will again help with weight loss.  Shared decision is to follow-up on appearing basis that she is no  longer having dyspnea like she did back in 2022 or anginal chest pain.  I will defer her back to her primary team for management of her other chronic comorbid conditions.  Patient states that in January 2025 she will reestablish care with [facility] physicians.  FINAL MEDICATION LIST END OF ENCOUNTER: No orders of the defined types were placed in this encounter.    Medications Discontinued During This Encounter  Medication Reason   OZEMPIC, 2 MG/DOSE, 8 MG/3ML SOPN      Current Outpatient Medications:    albuterol (VENTOLIN HFA) 108 (90 Base) MCG/ACT inhaler, Inhale 2 puffs into the lungs every 6 (six) hours as needed for wheezing or shortness of breath., Disp: , Rfl:    Ascorbic Acid (VITAMIN C PO), Take 1,500 mg by mouth daily., Disp: , Rfl:    buPROPion (WELLBUTRIN XL) 150 MG 24 hr tablet, Take 150 mg  by mouth in the morning and at bedtime., Disp: , Rfl:    cetirizine (ZYRTEC) 10 MG tablet, Take 10 mg by mouth daily., Disp: , Rfl:    EPINEPHrine 0.3 mg/0.3 mL IJ SOAJ injection, Inject 0.3 mg into the muscle as needed for anaphylaxis., Disp: , Rfl:    losartan (COZAAR) 25 MG tablet, Take 1 tablet by mouth daily., Disp: , Rfl:    metFORMIN (GLUCOPHAGE) 500 MG tablet, Take 1,000 mg by mouth daily with breakfast., Disp: , Rfl:    montelukast (SINGULAIR) 10 MG tablet, Take 10 mg by mouth every morning., Disp: , Rfl:    MOUNJARO 7.5 MG/0.5ML Pen, Inject 7.5 mg into the skin once a week., Disp: , Rfl:    norethindrone-ethinyl estradiol-iron (BLISOVI FE 1.5/30) 1.5-30 MG-MCG tablet, Take 1 tablet by mouth daily., Disp: , Rfl:    Omega-3 Fatty Acids (FISH OIL) 1000 MG CAPS, Take 1,000 mg by mouth daily., Disp: , Rfl:    ondansetron (ZOFRAN-ODT) 4 MG disintegrating tablet, Take 1 tablet (4 mg total) by mouth every 8 (eight) hours as needed., Disp: 20 tablet, Rfl: 0   rosuvastatin (CRESTOR) 5 MG tablet, Take 5 mg by mouth daily., Disp: , Rfl:    sertraline (ZOLOFT) 25 MG tablet, , Disp: , Rfl:     spironolactone (ALDACTONE) 25 MG tablet, Take 25 mg by mouth daily., Disp: , Rfl:    SUMAtriptan (IMITREX) 100 MG tablet, Take 100 mg by mouth every 2 (two) hours as needed for migraine. May repeat in 2 hours if headache persists or recurs., Disp: , Rfl:    UNABLE TO FIND, See admin instructions. Med Name: allergy injection every 3 weeks, Disp: , Rfl:   Orders Placed This Encounter  Procedures   EKG 12-Lead    There are no Patient Instructions on file for this visit.   --Continue cardiac medications as reconciled in final medication list. --Return if symptoms worsen or fail to improve. Or sooner if needed. --Continue follow-up with your primary care physician regarding the management of your other chronic comorbid conditions.  Patient's questions and concerns were addressed to her satisfaction. She voices understanding of the instructions provided during this encounter.   This note was created using a voice recognition software as a result there may be grammatical errors inadvertently enclosed that do not reflect the nature of this encounter. Every attempt is made to correct such errors.  Tessa Lerner, [place], [facility]  Pager:  [phone] Office: [phone]

## 2023-02-04 ENCOUNTER — Ambulatory Visit: Payer: BC Managed Care – PPO

## 2023-02-05 ENCOUNTER — Ambulatory Visit
Admission: RE | Admit: 2023-02-05 | Discharge: 2023-02-05 | Disposition: A | Discharge status: Home / Self Care | Payer: BC Managed Care – PPO | Source: Ambulatory Visit | Attending: Family Medicine | Admitting: Family Medicine

## 2023-02-05 DIAGNOSIS — Z1231 Encounter for screening mammogram for malignant neoplasm of breast: Secondary | ICD-10-CM

## 2023-03-12 NOTE — Progress Notes (Signed)
 [place] O Birchard MRN: [id number] DOB: 10-09-1975 (age: 47 y.o.)  Is in clinic today for follow up.  Follow-up for right middle finger trigger finger.  On exam this is a well-nourished well-developed individual in no acute distress.  They are alert and oriented x3.  Mood and affect are age-appropriate.  They are cooperative with the exam.  They ambulate without difficulty or assistive device.  Head is normocephalic atraumatic.  Neck is supple without meningismus.  Cranial nerves II through XII are grossly intact without deficit.  Skin is warm dry and intact without any signs of infection.  Musculoskeletal examination of the right middle finger with A1 pulley tenderness obvious triggering  Impression/plan  For second time the right middle finger A1 pulley is injected today.  We did discuss possibility of surgical release but would need to be least 3 months down the road if this injection did not work.  She will follow-up with us  1 month for clinical recheck.  1. Acquired trigger finger of right middle finger        Hand / Upper Extremity Injection/Arthrocentesis for trigger finger on 03/12/2023 4:15 PM Indications: pain and therapeutic Details: 27 G needle, volar approach Medications: 0.5 mg lidocaine  10 mg/mL (1 %); 20 mg triamcinolone acetonide 40 mg/mL Outcome: tolerated well, no immediate complications Procedure, treatment alternatives, risks and benefits explained, specific risks discussed. Consent was given by the patient. Immediately prior to procedure a time out was called to verify the correct patient, procedure, equipment, support staff and site/side marked as required. Patient was prepped and draped in the usual sterile fashion.

## 2023-11-20 NOTE — Progress Notes (Signed)
 Orthopaedic Surgery Hand and Upper Extremity History and Physical Examination  CC: Follow-up bilateral long trigger fingers and left ring trigger finger  HPI 11/20/2023: History of Present Illness 48 year old female returns for multiple trigger fingers. Received 2 injections in both middle fingers. Locking less prominent but still tight, with increased tightness, clicking, and locking at the knuckle. Similar symptoms in left ring finger and right top middle knuckle, both very sore. Discussed surgery last summer, open to it, curious about recovery and limitations. Prefers to start with right hand. No kidney issues, not on blood thinners. History of allergies, asthma, migraines, and diabetes, on medication.   Problem List:  Problem List[1]  Past Medical History: Medical History[2]   Medications: Current Rx ordered in [facility][3]  Allergies: Allergies as of 11/20/2023 - Reviewed 11/20/2023  Allergen Reaction Noted  . Mold Cough 12/20/2022    Past Surgical History: Surgical History[4]   Social History: Social History   Occupational History  . Not on file  Tobacco Use  . Smoking status: Never  . Smokeless tobacco: Never  Substance and Sexual Activity  . Alcohol use: No  . Drug use: Never  . Sexual activity: Not on file     Family History: Family History[5] Otherwise, no relevant orthopaedic family history  ROS: Review of Systems: All systems reviewed and are negative except that mentioned in HPI  Work/Sport/Hobbies: See HPI  Physical Examination: Vitals:   11/20/23 1040  BP: (!) 123/57  Pulse: 79  Temp: 97.6 F (36.4 C)   Constitutional: Awake, alert.  WN/WD Appearance: healthy, no acute distress, well-groomed Affect: Normal HEENT: EOMI, mucous membranes moist CV: RRR Pulm: breathing comfortably  Bilateral Upper Extremity / Hand Physical Exam Right hand: tenderness over long A1 pulley and PIP joint, stable PIP and DIP joints, unable to make composite  fist due to long finger.  Sensation intact on right long finger radial and ulnar aspects.  Slight triggering on exam with palpable nodule.  No extensor tendon subluxation.  Left upper extremity: tenderness over small finger A1 pulley and ring finger A1 pulley, mild active triggering over ring finger, no extensor tendon subluxation, able to make composite fist.  Results: Results X-rays show no signs of arthritis.    Assessment/Plan:  Assessment & Plan 1.  Right long trigger finger: Chronic worsening, threatening normal function of hand as unable to make a composite fist.  Failed nonoperative treatment including previous 2 steroid injections in both middle fingers. No arthritis on x-rays. Increased infection risk due to diabetes. - Recommend surgery for right middle finger - Administer steroid injection for left ring finger - Discussed risks and benefits of surgery, including infection, nerve damage, recurrence - Postoperative care: avoid heavy lifting for 3-4 weeks  2.  Left long trigger finger and left ring and small trigger fingers - Active triggering of the left ring trigger finger discussed steroid injection.  She would like to proceed with this.  3. Diabetes - On metformin - Discussed that this comorbidity has a significant impact on the diagnosis and surgical plan of the above conditions I am treating the patient for.  Follow-up - Surgery scheduler to set up appointment - Administer steroid injection today  PROCEDURE Received 2 injections in both middle fingers in the past.    Marsa HERO. Chiaramonti, MD Hand and Upper Extremity Surgery [facility] Department of Orthopaedic Surgery [facility] 11/20/2023 11:15 AM      [1] Patient Active Problem List Diagnosis  .  Prediabetes  . Urge incontinence of urine  . Essential hematuria  . Conductive hearing loss, bilateral  . Dysfunction of both eustachian tubes  .  Chronic pansinusitis  . Asthma  . Hematuria  . Visual loss, left eye  . Optic neuritis, left  . Retrocalcaneal bursitis (back of heel), left  . Heel spur, left  . Toenail fungus  . Tendonitis, Achilles, left  . Perforation of left tympanic membrane  . Nasal polyps  [2] Past Medical History: Diagnosis Date  . Allergy   . Asthma (CMD)   . Depression   . Diabetes mellitus    (CMD)   . Migraine   . Sleep apnea   [3] Meds Ordered in [facility]  Medication Sig Dispense Refill  . albuterol  HFA (PROVENTIL  HFA;VENTOLIN  HFA;PROAIR  HFA) 90 mcg/actuation inhaler Inhale.    . b complex vitamins tab tablet Take 1 tablet by mouth Once Daily.    . bumetanide  (BUMEX ) 1 mg tablet     . buPROPion  (WELLBUTRIN  XL) 150 mg 24 hr tablet Take 150 mg by mouth Once Daily.    . cetirizine (ZyrTEC) 10 mg tablet Take 10 mg by mouth.    . EPINEPHrine (EPIPEN) 0.3 mg/0.3 mL injection syringe as directed    . fluticasone  propionate (FLONASE ) 50 mcg/spray nasal spray Administer 2 sprays into each nostril daily. 16 g 5  . losartan  (COZAAR ) 25 mg tablet Take 1 tablet by mouth daily.    . metFORMIN (GLUCOPHAGE) 500 mg tablet Take 500 mg by mouth 2 (two) times a day.    . montelukast  (SINGULAIR ) 10 mg tablet     . norethindrone-e.estradioL-iron (Loestrin Fe 1.5/30, 28-Day,) 1.5 mg-30 mcg (21)/75 mg (7) tab Take  by mouth.    . ondansetron  (ZOFRAN -ODT) 8 mg disintegrating tablet DISSOLVE 1 TABLET ON THE TOUNGE AND ALLOW TO DISSOLVE AS NEEDED ONCE DAILY    . rosuvastatin  (CRESTOR ) 5 mg tablet Take 5 mg by mouth daily.    . sertraline  (ZOLOFT ) 25 mg tablet Take 1 tablet by mouth daily.    . spironolactone  (ALDACTONE ) 25 mg tablet 2 (two) times a day.    . SUMAtriptan  (IMITREX ) 100 mg tablet Take 100 mg by mouth.     No current [facility]-ordered facility-administered medications on file.  [4] Past Surgical History: Procedure Laterality Date  . EXAMINATION UNDER ANESTHESIA     Procedure: EUA TONSILS  . NOSE SURGERY      Procedure: NOSE SURGERY  . OTOPLASTY     Procedure: OTOPLASTY  [5] Family History Problem Relation Name Age of Onset  . Allergic rhinitis Father    . Diabetes Paternal Grandfather

## 2023-12-09 ENCOUNTER — Emergency Department (HOSPITAL_COMMUNITY)

## 2023-12-09 ENCOUNTER — Other Ambulatory Visit: Payer: Self-pay

## 2023-12-09 ENCOUNTER — Inpatient Hospital Stay (HOSPITAL_COMMUNITY)
Admission: EM | Admit: 2023-12-09 | Discharge: 2023-12-13 | DRG: 123 | Disposition: A | Discharge status: Home / Self Care | Source: Ambulatory Visit | Attending: Internal Medicine | Admitting: Internal Medicine

## 2023-12-09 ENCOUNTER — Inpatient Hospital Stay (HOSPITAL_COMMUNITY)

## 2023-12-09 ENCOUNTER — Encounter (HOSPITAL_COMMUNITY): Payer: Self-pay | Admitting: Pharmacy Technician

## 2023-12-09 DIAGNOSIS — Z7985 Long-term (current) use of injectable non-insulin antidiabetic drugs: Secondary | ICD-10-CM | POA: Diagnosis not present

## 2023-12-09 DIAGNOSIS — Z7984 Long term (current) use of oral hypoglycemic drugs: Secondary | ICD-10-CM | POA: Diagnosis not present

## 2023-12-09 DIAGNOSIS — G4733 Obstructive sleep apnea (adult) (pediatric): Secondary | ICD-10-CM | POA: Diagnosis present

## 2023-12-09 DIAGNOSIS — H538 Other visual disturbances: Secondary | ICD-10-CM | POA: Diagnosis present

## 2023-12-09 DIAGNOSIS — J45909 Unspecified asthma, uncomplicated: Secondary | ICD-10-CM | POA: Diagnosis present

## 2023-12-09 DIAGNOSIS — E785 Hyperlipidemia, unspecified: Secondary | ICD-10-CM | POA: Diagnosis present

## 2023-12-09 DIAGNOSIS — E119 Type 2 diabetes mellitus without complications: Secondary | ICD-10-CM | POA: Diagnosis present

## 2023-12-09 DIAGNOSIS — H4720 Unspecified optic atrophy: Secondary | ICD-10-CM | POA: Diagnosis present

## 2023-12-09 DIAGNOSIS — H469 Unspecified optic neuritis: Secondary | ICD-10-CM | POA: Diagnosis present

## 2023-12-09 DIAGNOSIS — E669 Obesity, unspecified: Secondary | ICD-10-CM

## 2023-12-09 DIAGNOSIS — E1165 Type 2 diabetes mellitus with hyperglycemia: Secondary | ICD-10-CM | POA: Diagnosis not present

## 2023-12-09 DIAGNOSIS — F32A Depression, unspecified: Secondary | ICD-10-CM | POA: Diagnosis present

## 2023-12-09 DIAGNOSIS — E66813 Obesity, class 3: Secondary | ICD-10-CM | POA: Diagnosis present

## 2023-12-09 DIAGNOSIS — G35 Multiple sclerosis: Secondary | ICD-10-CM | POA: Diagnosis present

## 2023-12-09 DIAGNOSIS — I10 Essential (primary) hypertension: Secondary | ICD-10-CM | POA: Diagnosis present

## 2023-12-09 DIAGNOSIS — Z79899 Other long term (current) drug therapy: Secondary | ICD-10-CM

## 2023-12-09 DIAGNOSIS — F419 Anxiety disorder, unspecified: Secondary | ICD-10-CM

## 2023-12-09 DIAGNOSIS — Z6841 Body Mass Index (BMI) 40.0 and over, adult: Secondary | ICD-10-CM | POA: Diagnosis not present

## 2023-12-09 DIAGNOSIS — Z9109 Other allergy status, other than to drugs and biological substances: Secondary | ICD-10-CM

## 2023-12-09 LAB — CBC WITH DIFFERENTIAL/PLATELET
Abs Immature Granulocytes: 0.03 K/uL (ref 0.00–0.07)
Basophils Absolute: 0.1 K/uL (ref 0.0–0.1)
Basophils Relative: 1 %
Eosinophils Absolute: 0.3 K/uL (ref 0.0–0.5)
Eosinophils Relative: 3 %
HCT: 43.3 % (ref 36.0–46.0)
Hemoglobin: 13.9 g/dL (ref 12.0–15.0)
Immature Granulocytes: 0 %
Lymphocytes Relative: 25 %
Lymphs Abs: 2.6 K/uL (ref 0.7–4.0)
MCH: 29.3 pg (ref 26.0–34.0)
MCHC: 32.1 g/dL (ref 30.0–36.0)
MCV: 91.2 fL (ref 80.0–100.0)
Monocytes Absolute: 0.5 K/uL (ref 0.1–1.0)
Monocytes Relative: 5 %
Neutro Abs: 6.8 K/uL (ref 1.7–7.7)
Neutrophils Relative %: 66 %
Platelets: 324 K/uL (ref 150–400)
RBC: 4.75 MIL/uL (ref 3.87–5.11)
RDW: 13.5 % (ref 11.5–15.5)
WBC: 10.3 K/uL (ref 4.0–10.5)
nRBC: 0 % (ref 0.0–0.2)

## 2023-12-09 LAB — CBG MONITORING, ED: Glucose-Capillary: 124 mg/dL — ABNORMAL HIGH (ref 70–99)

## 2023-12-09 LAB — COMPREHENSIVE METABOLIC PANEL WITH GFR
ALT: 15 U/L (ref 0–44)
AST: 20 U/L (ref 15–41)
Albumin: 3.3 g/dL — ABNORMAL LOW (ref 3.5–5.0)
Alkaline Phosphatase: 63 U/L (ref 38–126)
Anion gap: 12 (ref 5–15)
BUN: 12 mg/dL (ref 6–20)
CO2: 21 mmol/L — ABNORMAL LOW (ref 22–32)
Calcium: 9.1 mg/dL (ref 8.9–10.3)
Chloride: 104 mmol/L (ref 98–111)
Creatinine, Ser: 0.87 mg/dL (ref 0.44–1.00)
GFR, Estimated: >60 mL/min (ref >60)
Glucose, Bld: 128 mg/dL — ABNORMAL HIGH (ref 70–99)
Potassium: 4 mmol/L (ref 3.5–5.1)
Sodium: 137 mmol/L (ref 135–145)
Total Bilirubin: 0.4 mg/dL (ref 0.0–1.2)
Total Protein: 7.5 g/dL (ref 6.5–8.1)

## 2023-12-09 LAB — SEDIMENTATION RATE: Sed Rate: 25 mm/h — ABNORMAL HIGH (ref 0–22)

## 2023-12-09 LAB — HEMOGLOBIN A1C
Hgb A1c MFr Bld: 5.2 % (ref 4.8–5.6)
Mean Plasma Glucose: 102.54 mg/dL

## 2023-12-09 LAB — HCG, SERUM, QUALITATIVE: Preg, Serum: NEGATIVE

## 2023-12-09 LAB — C-REACTIVE PROTEIN: CRP: 2.6 mg/dL — ABNORMAL HIGH (ref <1.0)

## 2023-12-09 MED ORDER — SODIUM CHLORIDE 0.9 % IV SOLN
1000.0000 mg | INTRAVENOUS | Status: DC
  Administered 2023-12-09 – 2023-12-10 (×2): 1000 mg via INTRAVENOUS
  Filled 2023-12-09 (×3): qty 16

## 2023-12-09 MED ORDER — FLUTICASONE PROPIONATE 50 MCG/ACT NA SUSP
2.0000 | Freq: Every day | NASAL | Status: DC
  Administered 2023-12-11 – 2023-12-13 (×3): 2 via NASAL
  Filled 2023-12-09: qty 16

## 2023-12-09 MED ORDER — GADOBUTROL 1 MMOL/ML IV SOLN
10.0000 mL | Freq: Once | INTRAVENOUS | Status: AC | PRN
  Administered 2023-12-09: 10 mL via INTRAVENOUS

## 2023-12-09 MED ORDER — HYDROMORPHONE HCL 1 MG/ML IJ SOLN: 0.5000 mg | INTRAMUSCULAR | Status: DC | PRN

## 2023-12-09 MED ORDER — LORATADINE 10 MG PO TABS
10.0000 mg | ORAL_TABLET | Freq: Every day | ORAL | Status: DC
  Administered 2023-12-10 – 2023-12-13 (×4): 10 mg via ORAL
  Filled 2023-12-09 (×4): qty 1

## 2023-12-09 MED ORDER — LOSARTAN POTASSIUM 25 MG PO TABS
25.0000 mg | ORAL_TABLET | Freq: Every day | ORAL | Status: DC
  Administered 2023-12-10 – 2023-12-13 (×4): 25 mg via ORAL
  Filled 2023-12-09 (×4): qty 1

## 2023-12-09 MED ORDER — BRIMONIDINE TARTRATE 0.15 % OP SOLN
1.0000 [drp] | Freq: Two times a day (BID) | OPHTHALMIC | Status: DC
  Administered 2023-12-10 – 2023-12-13 (×8): 1 [drp] via OPHTHALMIC
  Filled 2023-12-09: qty 5

## 2023-12-09 MED ORDER — ALBUTEROL SULFATE (2.5 MG/3ML) 0.083% IN NEBU: 2.5000 mg | INHALATION_SOLUTION | RESPIRATORY_TRACT | Status: DC | PRN

## 2023-12-09 MED ORDER — MONTELUKAST SODIUM 10 MG PO TABS
10.0000 mg | ORAL_TABLET | Freq: Every evening | ORAL | Status: DC
  Administered 2023-12-10 – 2023-12-12 (×3): 10 mg via ORAL
  Filled 2023-12-09 (×4): qty 1

## 2023-12-09 MED ORDER — INSULIN ASPART 100 UNIT/ML IJ SOLN
0.0000 [IU] | Freq: Every day | INTRAMUSCULAR | Status: DC
  Administered 2023-12-10: 2 [IU] via SUBCUTANEOUS

## 2023-12-09 MED ORDER — ACETAMINOPHEN 325 MG PO TABS
650.0000 mg | ORAL_TABLET | Freq: Four times a day (QID) | ORAL | Status: DC | PRN
  Administered 2023-12-10 – 2023-12-12 (×4): 650 mg via ORAL
  Filled 2023-12-09 (×5): qty 2

## 2023-12-09 MED ORDER — VITAMIN C 500 MG PO TABS
1500.0000 mg | ORAL_TABLET | Freq: Every day | ORAL | Status: DC
  Administered 2023-12-10 – 2023-12-13 (×4): 1500 mg via ORAL
  Filled 2023-12-09 (×4): qty 3

## 2023-12-09 MED ORDER — OXYCODONE HCL 5 MG PO TABS: 5.0000 mg | ORAL_TABLET | ORAL | Status: DC | PRN

## 2023-12-09 MED ORDER — FISH OIL 1000 MG PO CAPS: 1000.0000 mg | ORAL_CAPSULE | Freq: Every day | ORAL | Status: DC

## 2023-12-09 MED ORDER — SERTRALINE HCL 50 MG PO TABS
25.0000 mg | ORAL_TABLET | Freq: Every day | ORAL | Status: DC
  Administered 2023-12-10 – 2023-12-13 (×4): 25 mg via ORAL
  Filled 2023-12-09 (×4): qty 1

## 2023-12-09 MED ORDER — ONDANSETRON HCL 4 MG PO TABS: 4.0000 mg | ORAL_TABLET | Freq: Four times a day (QID) | ORAL | Status: DC | PRN

## 2023-12-09 MED ORDER — ENOXAPARIN SODIUM 40 MG/0.4ML IJ SOSY
40.0000 mg | PREFILLED_SYRINGE | INTRAMUSCULAR | Status: DC
  Administered 2023-12-09 – 2023-12-11 (×3): 40 mg via SUBCUTANEOUS
  Filled 2023-12-09 (×3): qty 0.4

## 2023-12-09 MED ORDER — SPIRONOLACTONE 25 MG PO TABS
25.0000 mg | ORAL_TABLET | Freq: Every day | ORAL | Status: DC
  Administered 2023-12-10 – 2023-12-13 (×4): 25 mg via ORAL
  Filled 2023-12-09 (×4): qty 1

## 2023-12-09 MED ORDER — BUPROPION HCL ER (XL) 150 MG PO TB24
300.0000 mg | ORAL_TABLET | Freq: Every day | ORAL | Status: DC
  Administered 2023-12-10 – 2023-12-13 (×4): 300 mg via ORAL
  Filled 2023-12-09 (×5): qty 2

## 2023-12-09 MED ORDER — DOCUSATE SODIUM 100 MG PO CAPS
100.0000 mg | ORAL_CAPSULE | Freq: Two times a day (BID) | ORAL | Status: DC
  Administered 2023-12-10 – 2023-12-12 (×6): 100 mg via ORAL
  Filled 2023-12-09 (×6): qty 1

## 2023-12-09 MED ORDER — ONDANSETRON HCL 4 MG/2ML IJ SOLN
4.0000 mg | Freq: Four times a day (QID) | INTRAMUSCULAR | Status: DC | PRN
Start: 2023-12-09 — End: 2023-12-13

## 2023-12-09 MED ORDER — INSULIN ASPART 100 UNIT/ML IJ SOLN
0.0000 [IU] | Freq: Three times a day (TID) | INTRAMUSCULAR | Status: DC
  Administered 2023-12-10 – 2023-12-13 (×6): 2 [IU] via SUBCUTANEOUS

## 2023-12-09 MED ORDER — ROSUVASTATIN CALCIUM 5 MG PO TABS
5.0000 mg | ORAL_TABLET | ORAL | Status: DC
  Filled 2023-12-09: qty 1

## 2023-12-09 MED ORDER — SENNA 8.6 MG PO TABS
2.0000 | ORAL_TABLET | Freq: Every day | ORAL | Status: DC
  Administered 2023-12-10 – 2023-12-12 (×3): 17.2 mg via ORAL
  Filled 2023-12-09 (×3): qty 2

## 2023-12-09 MED ORDER — HYDRALAZINE HCL 20 MG/ML IJ SOLN: 10.0000 mg | Freq: Four times a day (QID) | INTRAMUSCULAR | Status: DC | PRN

## 2023-12-09 NOTE — ED Triage Notes (Signed)
 Pt sent here for evaluation of loss of vision to R eye. Symptom onset Saturday. Pt sent here for MRI.

## 2023-12-09 NOTE — ED Provider Triage Note (Signed)
 Emergency Medicine Provider Triage Evaluation Note  Abigail Vega , a 48 y.o. female  was evaluated in triage.  Pt complains of headache and right eye pain x 3 days. Evaluated by Dr. Lucie Stair today at [facility], concern for new episode of optic neuritis to the RIGHT EYE. Prior hx of optic neuritis to the LEFT eye in 2019.  According to paperwork provided by to me, it looks like Case was consulted with [facility] neurology Dr. Juliene Judge who agrees with an MRI stat for suspicious multiple sclerosis.  Will also need IV steroids.  According to records patient is on steroids, however she reports she has not taken any of these in several weeks.  Review of Systems  Positive: Headache, Right eye pain, decrease vision.  Negative: Vomiting  Physical Exam  BP (!) 141/82 (BP Location: Right Wrist)   Pulse (!) 114   Temp 98.2 F (36.8 C) (Oral)   Resp 19   SpO2 96%  Gen:   Awake, no distress   Resp:  Normal effort  MSK:   Moves extremities without difficulty  Other:  Pain with RIGHT eye movement.   Medical Decision Making  Medically screening exam initiated at 1:37 PM.  Appropriate orders placed.  Abigail O Pilat was informed that the remainder of the evaluation will be completed by another provider, this initial triage assessment does not replace that evaluation, and the importance of remaining in the ED until their evaluation is complete.     Chardae Mulkern, PA-C 12/09/23 1343

## 2023-12-09 NOTE — ED Provider Notes (Signed)
 4:20 PM Assumed care of patient from off-going team. For more details, please see note from same day.  In brief, this is a 48 y.o. female w/ R visual changes. Leading ddx is optic neuritis.  Plan/Dispo at time of sign-out & ED Course since sign-out: [ ]  MRI  BP (!) 141/82 (BP Location: Right Wrist)   Pulse (!) 114   Temp 98.2 F (36.8 C) (Oral)   Resp 19   SpO2 96%    ED Course:   Clinical Course as of 12/09/23 1730  Tue Dec 09, 2023  1710 MR Brain W and Wo Contrast MRI brain:  1. Unremarkable MRI appearance of the brain. No evidence of an acute intracranial abnormality. 2. 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  MRI orbits:  1. Prominent enhancement of the intraorbital right optic nerve and optic nerve sheath compatible with optic neuritis and perineuritis. 2. Left optic nerve atrophy. 3. Paranasal sinus disease as described. 4. 10 mm polypoid soft tissue focus within the right nasal passage. Direct visualization recommended.   [HN]  1715 D/w Dr. Matthews w/ neuro who recommends admission to medicine and 1g solumedrol q24h. [HN]  1730 Patient admitted to medicine. [HN]    Clinical Course User Index [HN] Franklyn Sid SAILOR, MD   ------------------------------- Sid Franklyn, MD Emergency Medicine  This note was created using dictation software, which may contain spelling or grammatical errors.   Franklyn Sid SAILOR, MD 12/09/23 [phone]

## 2023-12-09 NOTE — ED Provider Notes (Signed)
 [place] EM[facility]GENCY DEPARTMENT AT [facility] Provider Note   CSN: [id number] Arrival date & time: 12/09/23  1329     Patient presents with: Visual Field Change   Abigail Vega is a 48 y.o. female.  {Add pertinent medical, surgical, social history, OB history to HPI:6242} 48 year old female presents for evaluation.  Patient with complaint of right eye visual loss.  She was seen by Dr. Lucie Stair today at [facility].  She was sent to the ED for evaluation of possible right sided optic neuritis.  Patient with prior history of left-sided optic neuritis in 2019.  Case was presented to [facility] neurology Dr. Ramona by ophthalmology.  She was recommended to come to the ED for MRI imaging to rule out likely right-sided optic neuritis.  Patient reports onset of symptoms over the weekend on Saturday or Sunday.  She reports a blurry haze across her vision in the right eye.  She denies other complaint.  The history is provided by the patient and medical records.       Prior to Admission medications   Medication Sig Start Date End Date Taking? Authorizing Provider  albuterol  (VENTOLIN  HFA) 108 (90 Base) MCG/ACT inhaler Inhale 2 puffs into the lungs every 6 (six) hours as needed for wheezing or shortness of breath.    [provider]  Ascorbic Acid  (VITAMIN C  PO) Take 1,500 mg by mouth daily.    [provider]  buPROPion  (WELLBUTRIN  XL) 150 MG 24 hr tablet Take 150 mg by mouth in the morning and at bedtime.    [provider]  cetirizine (ZYRTEC) 10 MG tablet Take 10 mg by mouth daily.    [provider]  EPINEPHrine 0.3 mg/0.3 mL IJ SOAJ injection Inject 0.3 mg into the muscle as needed for anaphylaxis.    [provider]  losartan  (COZAAR ) 25 MG tablet Take 1 tablet by mouth daily. 06/12/22   [provider]  metFORMIN (GLUCOPHAGE) 500 MG tablet Take 1,000 mg by mouth daily with breakfast.    [provider]  montelukast  (SINGULAIR ) 10 MG tablet Take 10 mg by mouth every morning.    [provider]  MOUNJARO 7.5 MG/0.5ML Pen Inject 7.5 mg into the skin once a week. 12/25/22   [provider]  norethindrone-ethinyl estradiol-iron (BLISOVI FE 1.5/30) 1.5-30 MG-MCG tablet Take 1 tablet by mouth daily.    [provider]  Omega-3 Fatty Acids (FISH OIL ) 1000 MG CAPS Take 1,000 mg by mouth daily.    [provider]  ondansetron  (ZOFRAN -ODT) 4 MG disintegrating tablet Take 1 tablet (4 mg total) by mouth every 8 (eight) hours as needed. 08/06/22   Jerrol Agent, MD  rosuvastatin  (CRESTOR ) 5 MG tablet Take 5 mg by mouth daily. 03/27/22   [provider]  sertraline  (ZOLOFT ) 25 MG tablet  06/12/22   [provider]  spironolactone  (ALDACTONE ) 25 MG tablet Take 25 mg by mouth daily.    [provider]  SUMAtriptan  (IMITREX ) 100 MG tablet Take 100 mg by mouth every 2 (two) hours as needed for migraine. May repeat in 2 hours if headache persists or recurs.    [provider]  UNABLE TO FIND See admin instructions. Med Name: allergy injection every 3 weeks    [provider]    Allergies: Other    Review of Systems  All other systems reviewed and are negative.   Updated Vital Signs BP (!) 141/82 (BP Location: Right Wrist)  Pulse (!) 114   Temp 98.2 F (36.8 C) (Oral)   Resp 19   SpO2 96%   Physical Exam Vitals and nursing note reviewed.  Constitutional:      General: She is not in acute distress.    Appearance: Normal appearance. She is well-developed.  HENT:     Head: Normocephalic and atraumatic.  Eyes:     Conjunctiva/sclera: Conjunctivae normal.     Pupils: Pupils are equal, round, and reactive to light.  Cardiovascular:     Rate and Rhythm: Normal rate and regular rhythm.     Heart sounds: Normal heart sounds.  Pulmonary:     Effort: Pulmonary effort is normal. No respiratory distress.     Breath sounds:  Normal breath sounds.  Abdominal:     General: There is no distension.     Palpations: Abdomen is soft.     Tenderness: There is no abdominal tenderness.  Musculoskeletal:        General: No deformity. Normal range of motion.     Cervical back: Normal range of motion and neck supple.  Skin:    General: Skin is warm and dry.  Neurological:     General: No focal deficit present.     Mental Status: She is alert and oriented to person, place, and time. Mental status is at baseline.     Motor: No weakness.     (all labs ordered are listed, but only abnormal results are displayed) Labs Reviewed  C-REACTIVE PROTEIN - Abnormal; Notable for the following components:      Result Value   CRP 2.6 (*)    All other components within normal limits  COMPREHENSIVE METABOLIC PANEL WITH GFR - Abnormal; Notable for the following components:   CO2 21 (*)    Glucose, Bld 128 (*)    Albumin 3.3 (*)    All other components within normal limits  CBC WITH DIFFERENTIAL/PLATELET  HCG, SERUM, QUALITATIVE  SEDIMENTATION RATE    EKG: None  Radiology: No results found.  {Document cardiac monitor, telemetry assessment procedure when appropriate:32947} Procedures   Medications Ordered in the ED - No data to display    {Click here for ABCD2, HEART and other calculators REFRESH Note before signing:1}                              Medical Decision Making   Medical Screen Complete  This patient presented to the ED with complaint of ***.  This complaint involves an extensive number of treatment options. The initial differential diagnosis includes, but is not limited to, ***  This presentation is: {IllnessRisk:19196::***,Acute,Chronic,Self-Limited,Previously Undiagnosed,Uncertain Prognosis,Complicated,Systemic Symptoms,Threat to Life/Bodily Function}    Co morbidities that complicated the patient's evaluation  ***   Additional history obtained:  Additional history obtained  from {History source:19196::EMS,Spouse,Family,Friend,Caregiver} External records from outside sources obtained and reviewed including prior ED visits and prior Inpatient records.    Lab Tests:  I ordered and personally interpreted labs.  The pertinent results include:  ***   Imaging Studies ordered:  I ordered imaging studies including ***  I independently visualized and interpreted obtained imaging which showed *** I agree with the radiologist interpretation.   Cardiac Monitoring:  The patient was maintained on a cardiac monitor.  I personally viewed and interpreted the cardiac monitor which showed an underlying rhythm of: ***   Medicines ordered:  I ordered medication including ***  for ***  Reevaluation of the patient  after these medicines showed that the patient: {resolved/improved/worsened:23923::improved}    Test Considered:  ***   Critical Interventions:  ***   Consultations Obtained:  I consulted ***,  and discussed lab and imaging findings as well as pertinent plan of care.    Problem List / ED Course:  ***   Reevaluation:  After the interventions noted above, I reevaluated the patient and found that they have: {resolved/improved/worsened:23923::improved}   Social Determinants of Health:  ***   Disposition:  After consideration of the diagnostic results and the patients response to treatment, I feel that the patent would benefit from ***.    {Document critical care time when appropriate  Document review of labs and clinical decision tools ie CHADS2VASC2, etc  Document your independent review of radiology images and any outside records  Document your discussion with family members, caretakers and with consultants  Document social determinants of health affecting pt's care  Document your decision making why or why not admission, treatments were needed:32947:::1}   Final diagnoses:  None    ED Discharge Orders     None

## 2023-12-09 NOTE — H&P (Signed)
 History and Physical    Patient: Abigail Vega FMW:[id number] DOB: 1975-12-31 DOA: 12/09/2023 DOS: the patient was seen and examined on 12/09/2023 PCP: Okey Carlin Redbird, MD  Patient coming from: Home, outpatient eye clinic  Chief Complaint:  Chief Complaint  Patient presents with   Visual Field Change   HPI: Abigail Vega is a 48 y.o. female with medical history significant of hypertension, hyperlipidemia, type 2 diabetes mellitus, asthma, seasonal allergies, depression presented from outpatient eye clinic for evaluation of right-sided headaches, visual changes.  As per patient she has been experiencing headache for at least a week, blurred vision since Saturday.  Light does not bother her.  Her headache mostly above right eye, does not have associated dizziness, lightheadedness, tinnitus, ear pain. States she had history of left optic neuritis in 2019.  Patient is advised to come to the emergency department by ophthalmologist who discussed with [facility] neurology.  Patient denies nausea, vomiting, fever, chills, rigors, chest pain, shortness of breath.  No abdominal pain, urinary complaints.  No bowel complaints.  No change in appetite or weight.  In the emergency department, an MRI of the brain, orbits done showed findings of right-sided optic neuritis.  ED provider discussed with neurologist, started high-dose steroids.  TR[facility] consulted for inpatient admission.  Review of Systems: As mentioned in the history of present illness. All other systems reviewed and are negative. Past Medical History:  Diagnosis Date   Asthma    Depression    Hyperlipidemia    Hypertension    Seasonal allergies    Past Surgical History:  Procedure Laterality Date   COLONOSCOPY WITH PROPOFOL  N/A 12/01/2020   Procedure: COLONOSCOPY WITH PROPOFOL ;  Surgeon: Rosalie Kitchens, MD;  Location: [facility] ENDOSCOPY;  Service: Endoscopy;  Laterality: N/A;   NASAL SEPTOPLASTY W/ TURBINOPLASTY  1991   RIGHT/LEFT HEART CATH AND  CORONARY ANGIOGRAPHY N/A 01/02/2021   Procedure: RIGHT/LEFT HEART CATH AND CORONARY ANGIOGRAPHY;  Surgeon: Ladona Heinz, MD;  Location: [facility] INVASIVE CV LAB;  Service: Cardiovascular;  Laterality: N/A;   TONSILECTOMY, ADENOIDECTOMY, BILATERAL MYRINGOTOMY AND TUBES  1981   TYMPANOPLASTY  1986   Social History:  reports that she has never smoked. She has never used smokeless tobacco. She reports that she does not drink alcohol and does not use drugs.  Allergies  Allergen Reactions   Other Cough    Tree, molds, cat, dogs , mites Reports multiple environmental allergies    History reviewed. No pertinent family history.  Prior to Admission medications   Medication Sig Start Date End Date Taking? Authorizing Provider  albuterol  (VENTOLIN  HFA) 108 (90 Base) MCG/ACT inhaler Inhale 2 puffs into the lungs every 6 (six) hours as needed for wheezing or shortness of breath.    [provider]  Ascorbic Acid  (VITAMIN C  PO) Take 1,500 mg by mouth daily.    [provider]  buPROPion  (WELLBUTRIN  XL) 150 MG 24 hr tablet Take 150 mg by mouth in the morning and at bedtime.    [provider]  cetirizine (ZYRTEC) 10 MG tablet Take 10 mg by mouth daily.    [provider]  EPINEPHrine 0.3 mg/0.3 mL IJ SOAJ injection Inject 0.3 mg into the muscle as needed for anaphylaxis.    [provider]  losartan  (COZAAR ) 25 MG tablet Take 1 tablet by mouth daily. 06/12/22   [provider]  metFORMIN (GLUCOPHAGE) 500 MG tablet Take 1,000 mg by mouth daily with breakfast.    [provider]  montelukast  (SINGULAIR )  10 MG tablet Take 10 mg by mouth every morning.    [provider]  MOUNJARO 7.5 MG/0.5ML Pen Inject 7.5 mg into the skin once a week. 12/25/22   [provider]  norethindrone-ethinyl estradiol-iron (BLISOVI FE 1.5/30) 1.5-30 MG-MCG tablet Take 1 tablet by mouth daily.    [provider]  Omega-3 Fatty Acids (FISH OIL ) 1000 MG  CAPS Take 1,000 mg by mouth daily.    [provider]  ondansetron  (ZOFRAN -ODT) 4 MG disintegrating tablet Take 1 tablet (4 mg total) by mouth every 8 (eight) hours as needed. 08/06/22   Jerrol Agent, MD  rosuvastatin  (CRESTOR ) 5 MG tablet Take 5 mg by mouth daily. 03/27/22   [provider]  sertraline  (ZOLOFT ) 25 MG tablet  06/12/22   [provider]  spironolactone  (ALDACTONE ) 25 MG tablet Take 25 mg by mouth daily.    [provider]  SUMAtriptan  (IMITREX ) 100 MG tablet Take 100 mg by mouth every 2 (two) hours as needed for migraine. May repeat in 2 hours if headache persists or recurs.    [provider]  UNABLE TO FIND See admin instructions. Med Name: allergy injection every 3 weeks    [provider]    Physical Exam: Vitals:   12/09/23 1335 12/09/23 1700 12/09/23 1715  BP: (!) 141/82 (!) 119/52 120/64  Pulse: (!) 114 (!) 112 88  Resp: 19 18 18   Temp: 98.2 F (36.8 C)    TempSrc: Oral    SpO2: 96%  96%   General -   Middle aged Caucasian obese female, mild distress due to headache HEENT - PERRLA, EOMI, atraumatic head, non tender sinuses. Lung - distant, no rales, rhonchi, wheezes. Heart - S1, S2 heard, no murmurs, rubs, trace pedal edema. Abdomen - Soft, non tender, obese, bowel sounds good Neuro - Alert, awake and oriented x 3, non focal exam. Skin - Warm and dry. Data Reviewed:     Latest Ref Rng & Units 12/09/2023    1:49 PM 08/06/2022    7:08 AM 01/02/2021    1:43 PM  CBC  WBC 4.0 - 10.5 K/uL 10.3  22.6    Hemoglobin 12.0 - 15.0 g/dL 86.0  83.5  85.3   Hematocrit 36.0 - 46.0 % 43.3  50.0  43.0   Platelets 150 - 400 K/uL 324  402        Latest Ref Rng & Units 12/09/2023    1:49 PM 08/06/2022    7:08 AM 02/22/2021    2:38 PM  BMP  Glucose 70 - 99 mg/dL 871  831  897   BUN 6 - 20 mg/dL 12  17  12    Creatinine 0.44 - 1.00 mg/dL 9.12  8.77  9.16   BUN/Creat Ratio 9 - 23   14   Sodium 135 - 145 mmol/L 137  135  142    Potassium 3.5 - 5.1 mmol/L 4.0  3.7  4.1   Chloride 98 - 111 mmol/L 104  101  95   CO2 22 - 32 mmol/L 21  23  29    Calcium  8.9 - 10.3 mg/dL 9.1  9.5  9.1    MR Brain W and Wo Contrast Result Date: 12/09/2023 CLINICAL DATA:  Provided history: Vision loss, monocular. Additional history provided: Headache. Concern for optic neuritis on right. History of optic neuritis on left (2019). EXAM: MRI HEAD AND ORBITS WITHOUT AND WITH CONTRAST TECHNIQUE: Multiplanar, multiecho pulse sequences of the brain and surrounding structures were  obtained without and with intravenous contrast. Multiplanar, multiecho pulse sequences of the orbits and surrounding structures were obtained including fat saturation techniques, before and after intravenous contrast administration. CONTRAST:  10mL GADAVIST  GADOBUTROL  1 MMOL/ML IV SOLN COMPARISON:  MRI orbits 11/03/2017 (images available, report unavailable). FINDINGS: MRI HEAD FINDINGS Brain: Cerebral volume is normal. No cortical encephalomalacia is identified. No significant cerebral white matter disease. There is no acute infarct. No evidence of an intracranial mass. No extra-axial fluid collection. No midline shift. No pathologic intracranial enhancement identified. Vascular: Maintained flow voids within the proximal large arterial vessels. 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 Skull and upper cervical spine: No focal worrisome marrow lesion. MRI ORBITS FINDINGS Orbits: Prominent enhancement of the intraorbital right optic nerve and optic nerve sheath. Left optic nerve atrophy. The globes are normal in size and contour. The extraocular muscles and lacrimal glands are symmetric and unremarkable. No pathologic enhancement within the left orbit. Visualized sinuses: Postoperative appearance of the paranasal sinuses. Mucous retention cysts measuring up to 11 mm, and trace background mucosal thickening, within the left  maxillary sinus. Soft tissues: The visible maxillofacial and upper neck soft tissues are unremarkable. Other: 10 mm polypoid soft tissue focus within the right nasal passage. IMPRESSION: MRI brain: 1. Unremarkable MRI appearance of the brain. No evidence of an acute intracranial abnormality. 2. 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 MRI orbits: 1. Prominent enhancement of the intraorbital right optic nerve and optic nerve sheath compatible with optic neuritis and perineuritis. 2. Left optic nerve atrophy. 3. Paranasal sinus disease as described. 4. 10 mm polypoid soft tissue focus within the right nasal passage. Direct visualization recommended. Electronically Signed   By: Rockey Childs D.O.   On: 12/09/2023 17:05   MR ORBITS W WO CONTRAST Result Date: 12/09/2023 CLINICAL DATA:  Provided history: Vision loss, monocular. Additional history provided: Headache. Concern for optic neuritis on right. History of optic neuritis on left (2019). EXAM: MRI HEAD AND ORBITS WITHOUT AND WITH CONTRAST TECHNIQUE: Multiplanar, multiecho pulse sequences of the brain and surrounding structures were obtained without and with intravenous contrast. Multiplanar, multiecho pulse sequences of the orbits and surrounding structures were obtained including fat saturation techniques, before and after intravenous contrast administration. CONTRAST:  10mL GADAVIST  GADOBUTROL  1 MMOL/ML IV SOLN COMPARISON:  MRI orbits 11/03/2017 (images available, report unavailable). FINDINGS: MRI HEAD FINDINGS Brain: Cerebral volume is normal. No cortical encephalomalacia is identified. No significant cerebral white matter disease. There is no acute infarct. No evidence of an intracranial mass. No extra-axial fluid collection. No midline shift. No pathologic intracranial enhancement identified. Vascular: Maintained flow voids within the proximal large arterial vessels. Left sigmoid sinus diverticulum, measuring 2.1 x 0.6 cm  (for instance as seen on series 16, image 9). Skull and upper cervical spine: No focal worrisome marrow lesion. MRI ORBITS FINDINGS Orbits: Prominent enhancement of the intraorbital right optic nerve and optic nerve sheath. Left optic nerve atrophy. The globes are normal in size and contour. The extraocular muscles and lacrimal glands are symmetric and unremarkable. No pathologic enhancement within the left orbit. Visualized sinuses: Postoperative appearance of the paranasal sinuses. Mucous retention cysts measuring up to 11 mm, and trace background mucosal thickening, within the left maxillary sinus. Soft tissues: The visible maxillofacial and upper neck soft tissues are unremarkable. Other: 10 mm polypoid soft tissue focus within the right nasal passage. IMPRESSION: MRI brain: 1. Unremarkable MRI appearance of  the brain. No evidence of an acute intracranial abnormality. 2. 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 MRI orbits: 1. Prominent enhancement of the intraorbital right optic nerve and optic nerve sheath compatible with optic neuritis and perineuritis. 2. Left optic nerve atrophy. 3. Paranasal sinus disease as described. 4. 10 mm polypoid soft tissue focus within the right nasal passage. Direct visualization recommended. Electronically Signed   By: Rockey Childs D.O.   On: 12/09/2023 17:05     Assessment and Plan: Abigail Vega is a 48 y.o. female with medical history significant of hypertension, hyperlipidemia, type 2 diabetes mellitus, asthma, seasonal allergies, depression presented from outpatient eye clinic for evaluation of right-sided headaches, visual changes.  MRI of brain, orbits consistent with right-sided optic neuritis, she will need neurology evaluation, high-dose steroids and further management accordingly.  Plan: Right-sided optic neuritis: Patient has right eye visual changes, headache concentrated on right side. MRI findings discussed with  patient. Neurology evaluation called by ED. Continue high-dose steroids as ordered by ED.  She may need further workup of Multiple sclerosis. Continue pain control.  Hypertension: Patient will be continued on home dose losartan .  Type 2 diabetes mellitus: Hold metformin, Januvia. Accu-Cheks, sliding scale insulin  as per floor protocol ordered. Check A1c. Steroid can worsen her sugars discussed with her.  Depression: Continue sertraline , Wellbutrin  home dose  Hyperlipidemia: Continue home dose statin.  Obesity: BMI to be calculated. Counseled regarding diet, weight reduction. OSA-CPAP at night ordered    Advance Care Planning:   Code Status: Full Code discussed with patient.  Consults: Neurology  Family Communication: no family at bedside.  Severity of Illness: The appropriate patient status for this patient is INPATIENT. Inpatient status is judged to be reasonable and necessary in order to provide the required intensity of service to ensure the patient's safety. The patient's presenting symptoms, physical exam findings, and initial radiographic and laboratory data in the context of their chronic comorbidities is felt to place them at high risk for further clinical deterioration. Furthermore, it is not anticipated that the patient will be medically stable for discharge from the hospital within 2 midnights of admission.   * I certify that at the point of admission it is my clinical judgment that the patient will require inpatient hospital care spanning beyond 2 midnights from the point of admission due to high intensity of service, high risk for further deterioration and high frequency of surveillance required.*  Author: Concepcion Riser, MD 12/09/2023 5:36 PM  For on call review [email or web address].

## 2023-12-09 NOTE — Consult Note (Signed)
 NEUROLOGY CONSULT NOTE   Date of service: December 09, 2023 Patient Name: Abigail Vega MRN:  [id number] DOB:  September 22, 1975 Chief Complaint: R optic neuritis Requesting Provider: Darci Pore, MD  History of Present Illness  Abigail O Breault is a 48 y.o. female with hx of DM2, HTN, HLD, prior hx of left optic neuritis, who p/w R sided headache, R eye painful vision loss. Symptoms started and worsening since Saturday.  In the ED, MRI Brain and orbits with R optic neuritis.  She endorses painful eye movements specifically with horizontal gaze from left to right.   ROS  Comprehensive ROS performed and pertinent positives documented in HPI   Past History   Past Medical History:  Diagnosis Date   Asthma    Depression    Hyperlipidemia    Hypertension    Seasonal allergies     Past Surgical History:  Procedure Laterality Date   COLONOSCOPY WITH PROPOFOL  N/A 12/01/2020   Procedure: COLONOSCOPY WITH PROPOFOL ;  Surgeon: Rosalie Kitchens, MD;  Location: [facility];  Service: Endoscopy;  Laterality: N/A;   NASAL SEPTOPLASTY W/ TURBINOPLASTY  1991   RIGHT/LEFT HEART CATH AND CORONARY ANGIOGRAPHY N/A 01/02/2021   Procedure: RIGHT/LEFT HEART CATH AND CORONARY ANGIOGRAPHY;  Surgeon: Ladona Heinz, MD;  Location: [facility];  Service: Cardiovascular;  Laterality: N/A;   TONSILECTOMY, ADENOIDECTOMY, BILATERAL MYRINGOTOMY AND TUBES  1981   TYMPANOPLASTY  1986    Family History: History reviewed. No pertinent family history.  Social History  reports that she has never smoked. She has never used smokeless tobacco. She reports that she does not drink alcohol and does not use drugs.  Allergies  Allergen Reactions   Other Cough    Tree, molds, cat, dogs , mites Reports multiple environmental allergies    Medications   Current Facility-Administered Medications:    acetaminophen  (TYLENOL ) tablet 650 mg, 650 mg, Oral, Q6H PRN, Sreeram, Narendranath, MD   albuterol  (PROVENTIL ) (2.5  MG/3ML) 0.083% nebulizer solution 2.5 mg, 2.5 mg, Nebulization, Q2H PRN, Sreeram, Narendranath, MD   [START ON 12/10/2023] ascorbic acid  (VITAMIN C ) tablet 1,500 mg, 1,500 mg, Oral, Daily, Sreeram, Narendranath, MD   brimonidine  (ALPHAGAN ) 0.15 % ophthalmic solution 1 drop, 1 drop, Both Eyes, BID, Sreeram, Narendranath, MD   buPROPion  (WELLBUTRIN  XL) 24 hr tablet 300 mg, 300 mg, Oral, Daily, Sreeram, Narendranath, MD   docusate sodium  (COLACE) capsule 100 mg, 100 mg, Oral, BID, Sreeram, Narendranath, MD   enoxaparin  (LOVENOX ) injection 40 mg, 40 mg, Subcutaneous, Q24H, Sreeram, Narendranath, MD   fluticasone  (FLONASE ) 50 MCG/ACT nasal spray 2 spray, 2 spray, Each Nare, Daily, Sreeram, Narendranath, MD   hydrALAZINE  (APRESOLINE ) injection 10 mg, 10 mg, Intravenous, Q6H PRN, Sreeram, Narendranath, MD   [START ON 12/10/2023] insulin  aspart (novoLOG ) injection 0-15 Units, 0-15 Units, Subcutaneous, TID WC, Sreeram, Narendranath, MD   insulin  aspart (novoLOG ) injection 0-5 Units, 0-5 Units, Subcutaneous, QHS, Sreeram, Narendranath, MD   [START ON 12/10/2023] loratadine  (CLARITIN ) tablet 10 mg, 10 mg, Oral, Daily, Sreeram, Narendranath, MD   losartan  (COZAAR ) tablet 25 mg, 25 mg, Oral, Daily, Sreeram, Narendranath, MD   methylPREDNISolone  sodium succinate (SOLU-MEDROL ) 1,000 mg in sodium chloride  0.9 % 50 mL IVPB, 1,000 mg, Intravenous, Q24H, Sreeram, Narendranath, MD   montelukast  (SINGULAIR ) tablet 10 mg, 10 mg, Oral, QPM, Sreeram, Narendranath, MD   ondansetron  (ZOFRAN ) tablet 4 mg, 4 mg, Oral, Q6H PRN **OR** ondansetron  (ZOFRAN ) injection 4 mg, 4 mg, Intravenous, Q6H PRN, Sreeram, Narendranath, MD   oxyCODONE  (Oxy IR/ROXICODONE ) immediate  release tablet 5 mg, 5 mg, Oral, Q4H PRN, Sreeram, Narendranath, MD   [START ON 12/11/2023] rosuvastatin  (CRESTOR ) tablet 5 mg, 5 mg, Oral, Once per day on Monday Thursday, Cameron, Concepcion, MD   senna [place]) tablet 17.2 mg, 2 tablet, Oral, QHS, Sreeram,  Narendranath, MD   sertraline  (ZOLOFT ) tablet 25 mg, 25 mg, Oral, Daily, Sreeram, Narendranath, MD   spironolactone  (ALDACTONE ) tablet 25 mg, 25 mg, Oral, Daily, Sreeram, Narendranath, MD  Current Outpatient Medications:    albuterol  (VENTOLIN  HFA) 108 (90 Base) MCG/ACT inhaler, Inhale 2 puffs into the lungs every 6 (six) hours as needed for wheezing or shortness of breath., Disp: , Rfl:    Ascorbic Acid  (VITAMIN C  PO), Take 1,500 mg by mouth daily., Disp: , Rfl:    buPROPion  (WELLBUTRIN  XL) 150 MG 24 hr tablet, Take 150 mg by mouth in the morning and at bedtime., Disp: , Rfl:    buPROPion  (ZYBAN ) 150 MG 12 hr tablet, Take 150 mg by mouth 2 (two) times daily., Disp: , Rfl:    cetirizine (ZYRTEC) 10 MG tablet, Take 10 mg by mouth daily., Disp: , Rfl:    EPINEPHrine 0.3 mg/0.3 mL IJ SOAJ injection, Inject 0.3 mg into the muscle as needed for anaphylaxis., Disp: , Rfl:    fluticasone  (FLONASE ) 50 MCG/ACT nasal spray, Place 2 sprays into both nostrils daily., Disp: , Rfl:    losartan  (COZAAR ) 25 MG tablet, Take 1 tablet by mouth daily., Disp: , Rfl:    metFORMIN (GLUCOPHAGE) 500 MG tablet, Take 1,000 mg by mouth daily with breakfast., Disp: , Rfl:    montelukast  (SINGULAIR ) 10 MG tablet, Take 10 mg by mouth every evening., Disp: , Rfl:    norethindrone-ethinyl estradiol-iron (BLISOVI FE 1.5/30) 1.5-30 MG-MCG tablet, Take 1 tablet by mouth daily., Disp: , Rfl:    Omega-3 Fatty Acids (FISH OIL ) 1000 MG CAPS, Take 1,000 mg by mouth daily., Disp: , Rfl:    rosuvastatin  (CRESTOR ) 5 MG tablet, Take 5 mg by mouth 2 (two) times a week., Disp: , Rfl:    sertraline  (ZOLOFT ) 25 MG tablet, Take 25 mg by mouth daily., Disp: , Rfl:    spironolactone  (ALDACTONE ) 25 MG tablet, Take 25 mg by mouth daily., Disp: , Rfl:    SUMAtriptan  (IMITREX ) 100 MG tablet, Take 100 mg by mouth every 2 (two) hours as needed for migraine. May repeat in 2 hours if headache persists or recurs., Disp: , Rfl:    UNABLE TO FIND, See admin  instructions. Med Name: allergy injection every 3 weeks, Disp: , Rfl:   Vitals   Vitals:   12/09/23 1335 12/09/23 1700 12/09/23 1715  BP: (!) 141/82 (!) 119/52 120/64  Pulse: (!) 114 (!) 112 88  Resp: 19 18 18   Temp: 98.2 F (36.8 C)    TempSrc: Oral    SpO2: 96%  96%    There is no height or weight on file to calculate BMI.   Physical Exam   General: Laying comfortably in bed; in no acute distress.  HENT: Normal oropharynx and mucosa. Normal external appearance of ears and nose.  Neck: Supple, no pain or tenderness  CV: No JVD. No peripheral edema.  Pulmonary: Symmetric Chest rise. Normal respiratory effort.  Abdomen: Soft to touch, non-tender.  Ext: No cyanosis, edema, or deformity.  Trigger finger on the right. Skin: No rash. Normal palpation of skin.   Musculoskeletal: Normal digits and nails by inspection. No clubbing.   Neurologic Examination  Mental status/Cognition: Alert, oriented  to self, place, month and year, good attention.  Speech/language: Fluent, comprehension intact, object naming intact, repetition intact.  Cranial nerves:   CN II Pupils equal and reactive to light, right eye APD   CN III,IV,VI EOM intact, no gaze preference or deviation, no nystagmus    CN V normal sensation in V1, V2, and V3 segments bilaterally    CN VII no asymmetry, no nasolabial fold flattening    CN VIII normal hearing to speech    CN IX & X normal palatal elevation, no uvular deviation    CN XI 5/5 head turn and 5/5 shoulder shrug bilaterally    CN XII midline tongue protrusion    Motor:  Muscle bulk: Normal, tone normal, pronator drift none tremor none Mvmt Root Nerve  Muscle Right Left Comments  SA C5/6 Ax Deltoid 5 5   EF C5/6 Mc Biceps 5 5   EE C6/7/8 Rad Triceps 5 5   WF C6/7 Med FCR     WE C7/8 PIN ECU     F Ab C8/T1 U ADM/FDI 5 5   HF L1/2/3 Fem Illopsoas 5 5   KE L2/3/4 Fem Quad 5 5   DF L4/5 D Peron Tib Ant 5 5   PF S1/2 Tibial Grc/Sol 5 5    Sensation:   Light touch Intact throughout   Pin prick    Temperature    Vibration   Proprioception    Coordination/Complex Motor:  - Finger to Nose intact bilaterally - Heel to shin intact bilaterally - Rapid alternating movement intact bilaterally - Gait: Deferred for patient safety. Labs/Imaging/Neurodiagnostic studies   CBC:  Recent Labs  Lab 12-23-2023 1349  WBC 10.3  NEUTROABS 6.8  HGB 13.9  HCT 43.3  MCV 91.2  PLT 324   Basic Metabolic Panel:  Lab Results  Component Value Date   NA 137 23-Dec-2023   K 4.0 12/23/2023   CO2 21 (L) 12-23-23   GLUCOSE 128 (H) Dec 23, 2023   BUN 12 2023/12/23   CREATININE 0.87 2023/12/23   CALCIUM  9.1 12/23/2023   GFRNONAA >60 12/23/2023   Lipid Panel: No results found for: LDLCALC HgbA1c:  Lab Results  Component Value Date   HGBA1C 6.5 (H) 12/02/2020   Urine Drug Screen: No results found for: LABOPIA, COCAINSCRNUR, LABBENZ, AMPHETMU, THCU, LABBARB  Alcohol Level No results found for: ETH INR No results found for: INR APTT No results found for: APTT AED levels: No results found for: PHENYTOIN, ZONISAMIDE, LAMOTRIGINE, LEVETIRACETA  MRI Brain(Personally reviewed): 1. Unremarkable MRI appearance of the brain. No evidence of an acute intracranial abnormality. 2. 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  MRI orbits with and without contrast [personally reviewed]: 1. Prominent enhancement of the intraorbital right optic nerve and optic nerve sheath compatible with optic neuritis and perineuritis. 2. Left optic nerve atrophy. 3. Paranasal sinus disease as described.   ASSESSMENT   Abigail O Tapper is a 48 y.o. female with prior history of left optic neuritis in 2019 who presents with painful right eye vision loss with central vision worse than peripheral vision and found to have right optic neuritis.  RECOMMENDATIONS  -Agree with IV Solu-Medrol  1000 mg daily x 3-5 doses depending  on how she is doing.  PPI while on steroids. - close glucose monitoring given hx of diabetes and now on steroids. - NMO panel, MOG Antibody. - MRI C and T spine to evaluate for any spinal cord demyelinating lesions. - If no improvement  with steroids, will consider PLEX as the next step.  ______________________________________________________________________  Plan discussed with patient and her parents at the bedside.  Signed, Doranne Schmutz, MD [facility]

## 2023-12-10 DIAGNOSIS — E119 Type 2 diabetes mellitus without complications: Secondary | ICD-10-CM

## 2023-12-10 DIAGNOSIS — E66813 Obesity, class 3: Secondary | ICD-10-CM | POA: Diagnosis not present

## 2023-12-10 DIAGNOSIS — I10 Essential (primary) hypertension: Secondary | ICD-10-CM | POA: Diagnosis not present

## 2023-12-10 DIAGNOSIS — H469 Unspecified optic neuritis: Secondary | ICD-10-CM | POA: Diagnosis not present

## 2023-12-10 LAB — CBC
HCT: 46.3 % — ABNORMAL HIGH (ref 36.0–46.0)
Hemoglobin: 14.9 g/dL (ref 12.0–15.0)
MCH: 29.5 pg (ref 26.0–34.0)
MCHC: 32.2 g/dL (ref 30.0–36.0)
MCV: 91.7 fL (ref 80.0–100.0)
Platelets: 328 K/uL (ref 150–400)
RBC: 5.05 MIL/uL (ref 3.87–5.11)
RDW: 13.6 % (ref 11.5–15.5)
WBC: 8.1 K/uL (ref 4.0–10.5)
nRBC: 0 % (ref 0.0–0.2)

## 2023-12-10 LAB — GLUCOSE, CAPILLARY
Glucose-Capillary: 137 mg/dL — ABNORMAL HIGH (ref 70–99)
Glucose-Capillary: 227 mg/dL — ABNORMAL HIGH (ref 70–99)
Glucose-Capillary: 215 mg/dL — ABNORMAL HIGH (ref 70–99)

## 2023-12-10 LAB — CBG MONITORING, ED
Glucose-Capillary: 187 mg/dL — ABNORMAL HIGH (ref 70–99)
Glucose-Capillary: 127 mg/dL — ABNORMAL HIGH (ref 70–99)

## 2023-12-10 LAB — BASIC METABOLIC PANEL WITH GFR
Anion gap: 15 (ref 5–15)
BUN: 15 mg/dL (ref 6–20)
CO2: 21 mmol/L — ABNORMAL LOW (ref 22–32)
Calcium: 9.8 mg/dL (ref 8.9–10.3)
Chloride: 102 mmol/L (ref 98–111)
Creatinine, Ser: 1.04 mg/dL — ABNORMAL HIGH (ref 0.44–1.00)
GFR, Estimated: >60 mL/min (ref >60)
Glucose, Bld: 148 mg/dL — ABNORMAL HIGH (ref 70–99)
Potassium: 4.8 mmol/L (ref 3.5–5.1)
Sodium: 138 mmol/L (ref 135–145)

## 2023-12-10 LAB — HIV ANTIBODY (ROUTINE TESTING W REFLEX): HIV Screen 4th Generation wRfx: NONREACTIVE

## 2023-12-10 MED ORDER — PANTOPRAZOLE SODIUM 40 MG PO TBEC
40.0000 mg | DELAYED_RELEASE_TABLET | Freq: Every day | ORAL | Status: DC
  Administered 2023-12-10 – 2023-12-13 (×4): 40 mg via ORAL
  Filled 2023-12-10 (×4): qty 1

## 2023-12-10 NOTE — Progress Notes (Addendum)
 NEUROLOGY CONSULT FOLLOW UP NOTE   Date of service: December 10, 2023 Patient Name: Abigail Vega MRN:  [id number] DOB:  1975-07-27  Interval Hx/subjective   Reports overall stable symptoms with no more significant pain with eye movement.  She is still having trouble with her vision in her right eye but was able to differentiate between 1 and 2 fingers with the provider standing at the foot of the bed.  Colors are still significantly impacted. Vitals   Vitals:   12/10/23 0500 12/10/23 0605 12/10/23 0700 12/10/23 0800  BP: 114/69  122/74 129/75  Pulse: 77  83   Resp: 18     Temp:  97.7 F (36.5 C)    TempSrc:  Oral    SpO2: 95%  98%      There is no height or weight on file to calculate BMI.  Physical Exam    Physical Exam Gen: A&Ox4, NAD HEENT: Atraumatic, normocephalic; oropharynx clear, tongue without atrophy or fasciculations. Resp: CTAB, normal work of breathing CV: RRR, extremities appear well-perfused. Abd: soft/NT/ND Extrem: Nml bulk; no cyanosis, clubbing, or edema.  Neuro: *MS: A&O x4. Follows multi-step commands.  *Speech: no dysarthria or aphasia, able to name and repeat. *CN:    I: Deferred   II,III: Left pupil round and reactive to light, right pupil APD, VFF by confrontation with decreased visual acuity in the right eye (can differentiate 1 vs 2 fingers with provider at the foot of the bed), optic discs not visualized 2/2 pupillary constriction   III,IV,VI: EOMI w/o nystagmus, no ptosis   V: Sensation intact from V1 to V3 to LT   VII: Eyelid closure was full.  Smile symmetric.   VIII: Hearing intact to voice   IX,X: Voice normal, palate elevates symmetrically    XI: SCM/trap 5/5 bilat   XII: Tongue protrudes midline, no atrophy or fasciculations  *Motor:   Normal bulk.  No tremor, rigidity or bradykinesia. No pronator drift.   Strength: Dlt Bic Tri WE WrF FgS Gr HF KnF KnE PlF DoF    Left 5 5 5 5 5 5 5 5 5 5 5 5     Right 5 5 5 5 5 5 5 5 5 5 5 5     *Sensory: Intact to light touch, pinprick, temperature vibration throughout. Symmetric. Propioception intact bilat.  No double-simultaneous extinction.  *Coordination:  Finger-to-nose, heel-to-shin, rapid alternating motions were intact. *Reflexes:  2+ and symmetric throughout without clonus; toes down-going bilat *Gait: deferred  Medications  Current Facility-Administered Medications:    acetaminophen  (TYLENOL ) tablet 650 mg, 650 mg, Oral, Q6H PRN, Sreeram, Narendranath, MD   albuterol  (PROVENTIL ) (2.5 MG/3ML) 0.083% nebulizer solution 2.5 mg, 2.5 mg, Nebulization, Q2H PRN, Sreeram, Narendranath, MD   ascorbic acid  (VITAMIN C ) tablet 1,500 mg, 1,500 mg, Oral, Daily, Sreeram, Narendranath, MD   brimonidine  (ALPHAGAN ) 0.15 % ophthalmic solution 1 drop, 1 drop, Both Eyes, BID, Sreeram, Narendranath, MD, 1 drop at 12/10/23 0012   buPROPion  (WELLBUTRIN  XL) 24 hr tablet 300 mg, 300 mg, Oral, Daily, Sreeram, Narendranath, MD   docusate sodium  (COLACE) capsule 100 mg, 100 mg, Oral, BID, Sreeram, Narendranath, MD   enoxaparin  (LOVENOX ) injection 40 mg, 40 mg, Subcutaneous, Q24H, Sreeram, Narendranath, MD, 40 mg at 12/09/23 2025   fluticasone  (FLONASE ) 50 MCG/ACT nasal spray 2 spray, 2 spray, Each Nare, Daily, Sreeram, Narendranath, MD   hydrALAZINE  (APRESOLINE ) injection 10 mg, 10 mg, Intravenous, Q6H PRN, Sreeram, Narendranath, MD   insulin  aspart (novoLOG ) injection 0-15 Units, 0-15 Units,  Subcutaneous, TID WC, Sreeram, Narendranath, MD   insulin  aspart (novoLOG ) injection 0-5 Units, 0-5 Units, Subcutaneous, QHS, Sreeram, Narendranath, MD   loratadine  (CLARITIN ) tablet 10 mg, 10 mg, Oral, Daily, Sreeram, Narendranath, MD   losartan  (COZAAR ) tablet 25 mg, 25 mg, Oral, Daily, Sreeram, Narendranath, MD   methylPREDNISolone  sodium succinate (SOLU-MEDROL ) 1,000 mg in sodium chloride  0.9 % 50 mL IVPB, 1,000 mg, Intravenous, Q24H, Sreeram, Narendranath, MD, Stopped at 12/09/23 2018   montelukast   (SINGULAIR ) tablet 10 mg, 10 mg, Oral, QPM, Sreeram, Narendranath, MD   ondansetron  (ZOFRAN ) tablet 4 mg, 4 mg, Oral, Q6H PRN **OR** ondansetron  (ZOFRAN ) injection 4 mg, 4 mg, Intravenous, Q6H PRN, Sreeram, Narendranath, MD   oxyCODONE  (Oxy IR/ROXICODONE ) immediate release tablet 5 mg, 5 mg, Oral, Q4H PRN, Sreeram, Narendranath, MD   pantoprazole  (PROTONIX ) EC tablet 40 mg, 40 mg, Oral, Daily, Khaliqdina, Salman, MD   [START ON 12/11/2023] rosuvastatin  (CRESTOR ) tablet 5 mg, 5 mg, Oral, Once per day on Monday Thursday, Darci, Concepcion, MD   senna ([place]) tablet 17.2 mg, 2 tablet, Oral, QHS, Sreeram, Narendranath, MD   sertraline  (ZOLOFT ) tablet 25 mg, 25 mg, Oral, Daily, Sreeram, Narendranath, MD   spironolactone  (ALDACTONE ) tablet 25 mg, 25 mg, Oral, Daily, Sreeram, Narendranath, MD  Current Outpatient Medications:    albuterol  (VENTOLIN  HFA) 108 (90 Base) MCG/ACT inhaler, Inhale 2 puffs into the lungs every 6 (six) hours as needed for wheezing or shortness of breath., Disp: , Rfl:    Ascorbic Acid  (VITAMIN C  PO), Take 1,500 mg by mouth daily., Disp: , Rfl:    buPROPion  (WELLBUTRIN  XL) 150 MG 24 hr tablet, Take 150 mg by mouth in the morning and at bedtime., Disp: , Rfl:    buPROPion  (ZYBAN ) 150 MG 12 hr tablet, Take 150 mg by mouth 2 (two) times daily., Disp: , Rfl:    cetirizine (ZYRTEC) 10 MG tablet, Take 10 mg by mouth daily., Disp: , Rfl:    EPINEPHrine 0.3 mg/0.3 mL IJ SOAJ injection, Inject 0.3 mg into the muscle as needed for anaphylaxis., Disp: , Rfl:    fluticasone  (FLONASE ) 50 MCG/ACT nasal spray, Place 2 sprays into both nostrils daily., Disp: , Rfl:    losartan  (COZAAR ) 25 MG tablet, Take 1 tablet by mouth daily., Disp: , Rfl:    metFORMIN (GLUCOPHAGE) 500 MG tablet, Take 1,000 mg by mouth daily with breakfast., Disp: , Rfl:    montelukast  (SINGULAIR ) 10 MG tablet, Take 10 mg by mouth every evening., Disp: , Rfl:    norethindrone-ethinyl estradiol-iron (BLISOVI FE 1.5/30)  1.5-30 MG-MCG tablet, Take 1 tablet by mouth daily., Disp: , Rfl:    Omega-3 Fatty Acids (FISH OIL ) 1000 MG CAPS, Take 1,000 mg by mouth daily., Disp: , Rfl:    rosuvastatin  (CRESTOR ) 5 MG tablet, Take 5 mg by mouth 2 (two) times a week., Disp: , Rfl:    sertraline  (ZOLOFT ) 25 MG tablet, Take 25 mg by mouth daily., Disp: , Rfl:    spironolactone  (ALDACTONE ) 25 MG tablet, Take 25 mg by mouth daily., Disp: , Rfl:    SUMAtriptan  (IMITREX ) 100 MG tablet, Take 100 mg by mouth every 2 (two) hours as needed for migraine. May repeat in 2 hours if headache persists or recurs., Disp: , Rfl:    UNABLE TO FIND, See admin instructions. Med Name: allergy injection every 3 weeks, Disp: , Rfl:   Labs and Diagnostic Imaging   CBC:  Recent Labs  Lab 12/09/23 1349 12/10/23 0613  WBC 10.3 8.1  NEUTROABS  6.8  --   HGB 13.9 14.9  HCT 43.3 46.3*  MCV 91.2 91.7  PLT 324 328    Basic Metabolic Panel:  Lab Results  Component Value Date   NA 138 12/10/2023   K 4.8 12/10/2023   CO2 21 (L) 12/10/2023   GLUCOSE 148 (H) 12/10/2023   BUN 15 12/10/2023   CREATININE 1.04 (H) 12/10/2023   CALCIUM  9.8 12/10/2023   GFRNONAA >60 12/10/2023   Lipid Panel: No results found for: LDLCALC HgbA1c:  Lab Results  Component Value Date   HGBA1C 5.2 12/09/2023   Urine Drug Screen: No results found for: LABOPIA, COCAINSCRNUR, LABBENZ, AMPHETMU, THCU, LABBARB  Alcohol Level No results found for: ETH INR No results found for: INR APTT No results found for: APTT AED levels: No results found for: PHENYTOIN, ZONISAMIDE, LAMOTRIGINE, LEVETIRACETA  MR CERVICAL SPINE W WO CONTRAST MR THORACIC SPINE W WO CONTRAST Result Date: 12/09/2023 IMPRESSION: 1. Normal cord signal. No evidence of demyelinating disease in the cervical or thoracic spine. 2. No significant canal or foraminal stenosis. Electronically Signed   By: Gilmore GORMAN Molt M.D.   On: 12/09/2023 22:46   MR Brain W and Wo Contrast MR  ORBITS W WO CONTRAST Result Date: 12/09/2023 IMPRESSION: MRI brain: 1. Unremarkable MRI appearance of the brain. No evidence of an acute intracranial abnormality. 2. 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 MRI orbits: 1. Prominent enhancement of the intraorbital right optic nerve and optic nerve sheath compatible with optic neuritis and perineuritis. 2. Left optic nerve atrophy. 3. Paranasal sinus disease as described. 4. 10 mm polypoid soft tissue focus within the right nasal passage. Direct visualization recommended. Electronically Signed   By: Rockey Childs D.O.   On: 12/09/2023 17:05   Assessment   Abigail Vega is a 48 y.o. female with HTN, HLD, T2DM, asthma, depression, and prior left optic neuritis in 2019 who presented with 1 week of right sided headache and blurry vision. MRI orbits showed prominent enhancement of the right optic nerve. MRI brain did not show any other significant abnormalities including MS lesions. She was started on IV steroids.   Overall stable since starting Solu-Medrol  the evening of 12/09/2023, s/p 1 dose.  MRI brain, C-spine, T-spine w/wo showed no evidence of demyelinating disorder.  With her comorbid conditions including hypertension, hyperlipidemia, and diabetes nonarteritic anterior ischemic optic neuropathy (NAION) is still in the differential. Previously been on semaglutide into his appetite however both of these were over 1 year ago and her prior optic neuritis episode in 2019 preceded starting a GLP-1 agonist. One study showed increased risk of NAION while on semaglutide however subsequent larger study found no increased risk (Hathaway et al. 2024; Hsu et al. 2025).  Will plan to treat as optic neuritis with 3-5 days of IV Solu-Medrol  1 g and since she has a follow-up with Dr. Skeet already scheduled we will hold off on having her follow-up with Dr. Vear although she knows that Dr. Skeet may still send her to Dr. Vear in the future.    Recommendations  Continue IV Solu-Medrol  1 g every 24 hours for 3-5 days, day 2 Send NMO panel, MOG ab Has follow-up with Dr. Skeet so we will hold off on scheduling follow-up with Dr. Vear upon discharge Follow blood sugars closely while on steroids Continue pantoprazole  40 mg daily while on steroids If no improvement on IV steroids, we will consider PLEX ______________________________________________________________________   Bonney Fairy Pool, DO Internal Medicine  Resident, PGY-3 8:17 AM 12/10/2023   Attending Neurohospitalist Addendum Patient seen and examined with APP/Resident. Agree with the history and physical as documented above. Agree with the plan as documented, which I helped formulate. I have edited the note above to reflect my full findings and recommendations. I have independently reviewed the chart, obtained history, review of systems and examined the patient.I have personally reviewed pertinent head/neck/spine imaging (CT/MRI). Please feel free to call with any questions.  -- Elida Ross, MD [facility] [phone]  If 7pm- 7am, please page neurology on call as listed in [facility].

## 2023-12-10 NOTE — Progress Notes (Signed)
 Progress Note   Patient: Abigail Vega FMW:[id number] DOB: 10-09-1975 DOA: 12/09/2023     1 DOS: the patient was seen and examined on 12/10/2023   Brief hospital course: Abigail Vega is a 48 y.o. female with medical history significant of hypertension, hyperlipidemia, type 2 diabetes mellitus, asthma, seasonal allergies, depression presented from outpatient eye clinic for evaluation of right-sided headaches, visual changes.  MRI of brain, orbits consistent with right-sided optic neuritis, Neurology advised high dose steroids.  Assessment and Plan: Right-sided optic neuritis: Right eye visual changes persist, headache, eye pain better. Neurology follow up appreciated. Continue IV Solu-Medrol  1 g every 24 hours for 3-5 days .  Further work up as per neurology recommendations.   Hypertension: BP stable, continue losartan .   Type 2 diabetes mellitus: Hold metformin, Januvia. Well controlled A1C 5.2.  As she is on high dose steroids will continue Accu-Cheks, sliding scale insulin  ordered.   Depression: Continue sertraline , Wellbutrin .   Hyperlipidemia: Continue home dose statin.   Obesity class 3 BMI 46. Counseled regarding diet, weight reduction. OSA-CPAP at night ordered.     Out of bed to chair. Incentive spirometry. Nursing supportive care. Fall, aspiration precautions. Diet:  Diet Orders (From admission, onward)     Start     Ordered   12/09/23 1736  Diet heart healthy/carb modified Room service appropriate? Yes; Fluid consistency: Thin  Diet effective now       Question Answer Comment  Diet-HS Snack? Nothing   Room service appropriate? Yes   Fluid consistency: Thin      12/09/23 1736           DVT prophylaxis: enoxaparin  (LOVENOX ) injection 40 mg Start: 12/09/23 1800 SCDs Start: 12/09/23 1735  Level of care: Med-Surg   Code Status: Full Code  Subjective: Patient is seen and examined today morning. States her right eye pain better. Headache improved.  Right sided blurred vision persists.  Physical Exam: Vitals:   12/10/23 0924 12/10/23 1302 12/10/23 1342 12/10/23 1817  BP: (!) 146/116 117/64 129/61 121/66  Pulse: 83 83 82 78  Resp: 12 12 18 16   Temp: 98.1 F (36.7 C) 98.1 F (36.7 C) 97.6 F (36.4 C) 98 F (36.7 C)  TempSrc: Oral Oral Oral Oral  SpO2: 93% 99% 97% 94%    General -   Middle aged obese Caucasian obese female, no distress HEENT - PERRLA, EOMI, atraumatic head, non tender sinuses. Lung - distant, no rales, rhonchi, wheezes. Heart - S1, S2 heard, no murmurs, rubs, trace pedal edema. Abdomen - Soft, non tender, obese, bowel sounds good Neuro - Alert, awake and oriented x 3, non focal exam. Skin - Warm and dry.  Data Reviewed:      Latest Ref Rng & Units 12/10/2023    6:13 AM 12/09/2023    1:49 PM 08/06/2022    7:08 AM  CBC  WBC 4.0 - 10.5 K/uL 8.1  10.3  22.6   Hemoglobin 12.0 - 15.0 g/dL 85.0  86.0  83.5   Hematocrit 36.0 - 46.0 % 46.3  43.3  50.0   Platelets 150 - 400 K/uL 328  324  402       Latest Ref Rng & Units 12/10/2023    6:13 AM 12/09/2023    1:49 PM 08/06/2022    7:08 AM  BMP  Glucose 70 - 99 mg/dL 851  871  831   BUN 6 - 20 mg/dL 15  12  17    Creatinine 0.44 - 1.00 mg/dL  1.04  0.87  1.22   Sodium 135 - 145 mmol/L 138  137  135   Potassium 3.5 - 5.1 mmol/L 4.8  4.0  3.7   Chloride 98 - 111 mmol/L 102  104  101   CO2 22 - 32 mmol/L 21  21  23    Calcium  8.9 - 10.3 mg/dL 9.8  9.1  9.5    MR CERVICAL SPINE W WO CONTRAST Result Date: 12/09/2023 CLINICAL DATA:  Demyelinating disease EXAM: MRI CERVICAL AND THORACIC SPINE WITHOUT AND WITH CONTRAST TECHNIQUE: Multiplanar and multiecho pulse sequences of the cervical spine, to include the craniocervical junction and cervicothoracic junction, and the thoracic spine, were obtained without and with intravenous contrast. CONTRAST:  10mL GADAVIST  GADOBUTROL  1 MMOL/ML IV SOLN COMPARISON:  None Available. FINDINGS: MRI CERVICAL SPINE FINDINGS Alignment: No  substantial sagittal subluxation. Vertebrae: No fracture, evidence of discitis, or bone lesion. Cord: Normal cord signal.  No abnormal enhancement. Posterior Fossa, vertebral arteries, paraspinal tissues: Negative. Disc levels: Mild disc bulging and facet arthropathy at multiple levels without significant canal or foraminal stenosis. MRI THORACIC SPINE FINDINGS Alignment:  Physiologic. Vertebrae: No fracture, evidence of discitis, or bone lesion. Cord:  Normal cord signal.  No abnormal enhancement. Paraspinal and other soft tissues: Negative. Disc levels: No significant canal or foraminal stenosis. IMPRESSION: 1. Normal cord signal. No evidence of demyelinating disease in the cervical or thoracic spine. 2. No significant canal or foraminal stenosis. Electronically Signed   By: Gilmore GORMAN Molt M.D.   On: 12/09/2023 22:46   MR THORACIC SPINE W WO CONTRAST Result Date: 12/09/2023 CLINICAL DATA:  Demyelinating disease EXAM: MRI CERVICAL AND THORACIC SPINE WITHOUT AND WITH CONTRAST TECHNIQUE: Multiplanar and multiecho pulse sequences of the cervical spine, to include the craniocervical junction and cervicothoracic junction, and the thoracic spine, were obtained without and with intravenous contrast. CONTRAST:  10mL GADAVIST  GADOBUTROL  1 MMOL/ML IV SOLN COMPARISON:  None Available. FINDINGS: MRI CERVICAL SPINE FINDINGS Alignment: No substantial sagittal subluxation. Vertebrae: No fracture, evidence of discitis, or bone lesion. Cord: Normal cord signal.  No abnormal enhancement. Posterior Fossa, vertebral arteries, paraspinal tissues: Negative. Disc levels: Mild disc bulging and facet arthropathy at multiple levels without significant canal or foraminal stenosis. MRI THORACIC SPINE FINDINGS Alignment:  Physiologic. Vertebrae: No fracture, evidence of discitis, or bone lesion. Cord:  Normal cord signal.  No abnormal enhancement. Paraspinal and other soft tissues: Negative. Disc levels: No significant canal or foraminal  stenosis. IMPRESSION: 1. Normal cord signal. No evidence of demyelinating disease in the cervical or thoracic spine. 2. No significant canal or foraminal stenosis. Electronically Signed   By: Gilmore GORMAN Molt M.D.   On: 12/09/2023 22:46   MR Brain W and Wo Contrast Result Date: 12/09/2023 CLINICAL DATA:  Provided history: Vision loss, monocular. Additional history provided: Headache. Concern for optic neuritis on right. History of optic neuritis on left (2019). EXAM: MRI HEAD AND ORBITS WITHOUT AND WITH CONTRAST TECHNIQUE: Multiplanar, multiecho pulse sequences of the brain and surrounding structures were obtained without and with intravenous contrast. Multiplanar, multiecho pulse sequences of the orbits and surrounding structures were obtained including fat saturation techniques, before and after intravenous contrast administration. CONTRAST:  10mL GADAVIST  GADOBUTROL  1 MMOL/ML IV SOLN COMPARISON:  MRI orbits 11/03/2017 (images available, report unavailable). FINDINGS: MRI HEAD FINDINGS Brain: Cerebral volume is normal. No cortical encephalomalacia is identified. No significant cerebral white matter disease. There is no acute infarct. No evidence of an intracranial mass. No extra-axial fluid collection. No midline shift. No  pathologic intracranial enhancement identified. Vascular: Maintained flow voids within the proximal large arterial vessels. 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 Skull and upper cervical spine: No focal worrisome marrow lesion. MRI ORBITS FINDINGS Orbits: Prominent enhancement of the intraorbital right optic nerve and optic nerve sheath. Left optic nerve atrophy. The globes are normal in size and contour. The extraocular muscles and lacrimal glands are symmetric and unremarkable. No pathologic enhancement within the left orbit. Visualized sinuses: Postoperative appearance of the paranasal sinuses. Mucous retention cysts  measuring up to 11 mm, and trace background mucosal thickening, within the left maxillary sinus. Soft tissues: The visible maxillofacial and upper neck soft tissues are unremarkable. Other: 10 mm polypoid soft tissue focus within the right nasal passage. IMPRESSION: MRI brain: 1. Unremarkable MRI appearance of the brain. No evidence of an acute intracranial abnormality. 2. 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 MRI orbits: 1. Prominent enhancement of the intraorbital right optic nerve and optic nerve sheath compatible with optic neuritis and perineuritis. 2. Left optic nerve atrophy. 3. Paranasal sinus disease as described. 4. 10 mm polypoid soft tissue focus within the right nasal passage. Direct visualization recommended. Electronically Signed   By: Rockey Childs D.O.   On: 12/09/2023 17:05   MR ORBITS W WO CONTRAST Result Date: 12/09/2023 CLINICAL DATA:  Provided history: Vision loss, monocular. Additional history provided: Headache. Concern for optic neuritis on right. History of optic neuritis on left (2019). EXAM: MRI HEAD AND ORBITS WITHOUT AND WITH CONTRAST TECHNIQUE: Multiplanar, multiecho pulse sequences of the brain and surrounding structures were obtained without and with intravenous contrast. Multiplanar, multiecho pulse sequences of the orbits and surrounding structures were obtained including fat saturation techniques, before and after intravenous contrast administration. CONTRAST:  10mL GADAVIST  GADOBUTROL  1 MMOL/ML IV SOLN COMPARISON:  MRI orbits 11/03/2017 (images available, report unavailable). FINDINGS: MRI HEAD FINDINGS Brain: Cerebral volume is normal. No cortical encephalomalacia is identified. No significant cerebral white matter disease. There is no acute infarct. No evidence of an intracranial mass. No extra-axial fluid collection. No midline shift. No pathologic intracranial enhancement identified. Vascular: Maintained flow voids within the proximal  large arterial vessels. 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 Skull and upper cervical spine: No focal worrisome marrow lesion. MRI ORBITS FINDINGS Orbits: Prominent enhancement of the intraorbital right optic nerve and optic nerve sheath. Left optic nerve atrophy. The globes are normal in size and contour. The extraocular muscles and lacrimal glands are symmetric and unremarkable. No pathologic enhancement within the left orbit. Visualized sinuses: Postoperative appearance of the paranasal sinuses. Mucous retention cysts measuring up to 11 mm, and trace background mucosal thickening, within the left maxillary sinus. Soft tissues: The visible maxillofacial and upper neck soft tissues are unremarkable. Other: 10 mm polypoid soft tissue focus within the right nasal passage. IMPRESSION: MRI brain: 1. Unremarkable MRI appearance of the brain. No evidence of an acute intracranial abnormality. 2. 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 MRI orbits: 1. Prominent enhancement of the intraorbital right optic nerve and optic nerve sheath compatible with optic neuritis and perineuritis. 2. Left optic nerve atrophy. 3. Paranasal sinus disease as described. 4. 10 mm polypoid soft tissue focus within the right nasal passage. Direct visualization recommended. Electronically Signed   By: Rockey Childs D.O.   On: 12/09/2023 17:05  Family Communication: Discussed with patient, understand and agree. All questions answered.  Disposition: Status is: Inpatient Remains inpatient appropriate because: IV steorids, work up of optic neuritis.  Planned Discharge Destination: Home     Time spent: 43 minutes  Author: Concepcion Riser, MD 12/10/2023 7:06 PM Secure chat 7am to 7pm For on call review [email or web address].

## 2023-12-11 DIAGNOSIS — H469 Unspecified optic neuritis: Secondary | ICD-10-CM | POA: Diagnosis not present

## 2023-12-11 DIAGNOSIS — E66813 Obesity, class 3: Secondary | ICD-10-CM | POA: Diagnosis not present

## 2023-12-11 DIAGNOSIS — I10 Essential (primary) hypertension: Secondary | ICD-10-CM | POA: Diagnosis not present

## 2023-12-11 DIAGNOSIS — E119 Type 2 diabetes mellitus without complications: Secondary | ICD-10-CM | POA: Diagnosis not present

## 2023-12-11 LAB — GLUCOSE, CAPILLARY
Glucose-Capillary: 141 mg/dL — ABNORMAL HIGH (ref 70–99)
Glucose-Capillary: 136 mg/dL — ABNORMAL HIGH (ref 70–99)
Glucose-Capillary: 101 mg/dL — ABNORMAL HIGH (ref 70–99)
Glucose-Capillary: 175 mg/dL — ABNORMAL HIGH (ref 70–99)

## 2023-12-11 MED ORDER — SODIUM CHLORIDE 0.9 % IV SOLN
1000.0000 mg | INTRAVENOUS | Status: DC
  Administered 2023-12-11: 1000 mg via INTRAVENOUS
  Filled 2023-12-11 (×2): qty 16

## 2023-12-11 NOTE — Progress Notes (Signed)
 Progress Note   Patient: Abigail Vega FMW:[id number] DOB: Dec 21, 1975 DOA: 12/09/2023     2 DOS: the patient was seen and examined on 12/11/2023   Brief hospital course: Abigail O Weidinger is a 48 y.o. female with medical history significant of hypertension, hyperlipidemia, type 2 diabetes mellitus, asthma, seasonal allergies, depression presented from outpatient eye clinic for evaluation of right-sided headaches, visual changes.  MRI of brain, orbits consistent with right-sided optic neuritis, Neurology advised high dose steroids.  Assessment and Plan: Right-sided optic neuritis: Right eye visual changes persist, but headache, eye pain better. Neurology follow up appreciated. Continue IV Solu-Medrol  1 g every 24 hours for total 5 days. NMO panel, MOG ab pending. Further work up as per neurology recommendations.   Hypertension: BP stable, continue losartan .   Type 2 diabetes mellitus: Hold metformin, Januvia. Well controlled A1C 5.2.  Sugars high as she is on IV steroids, continue Accu-Cheks, sliding scale insulin  per protocol.   Depression: Continue sertraline , Wellbutrin .   Hyperlipidemia: Continue home dose statin.   Obesity class 3 BMI 46. Counseled regarding diet, weight reduction. OSA-CPAP at night ordered.     Out of bed to chair. Incentive spirometry. Nursing supportive care. Fall, aspiration precautions. Diet:  Diet Orders (From admission, onward)     Start     Ordered   12/09/23 1736  Diet heart healthy/carb modified Room service appropriate? Yes; Fluid consistency: Thin  Diet effective now       Question Answer Comment  Diet-HS Snack? Nothing   Room service appropriate? Yes   Fluid consistency: Thin      12/09/23 1736           DVT prophylaxis: enoxaparin  (LOVENOX ) injection 40 mg Start: 12/09/23 1800 SCDs Start: 12/09/23 1735  Level of care: Med-Surg   Code Status: Full Code  Subjective: Patient is seen and examined today morning. Sitting in chair.  States her right eye vision better getting better but blurred vision persists.  Physical Exam: Vitals:   12/11/23 0025 12/11/23 0448 12/11/23 0936 12/11/23 1654  BP: (!) 106/56 122/66 123/68 119/71  Pulse: 67 72 72 67  Resp: 17 17 17 17   Temp:  (!) 97.5 F (36.4 C)  (!) 97.5 F (36.4 C)  TempSrc:  Oral  Oral  SpO2: 94% 96% 95% 95%    General -   Middle aged obese Caucasian obese female, no distress HEENT - PERRLA, EOMI, atraumatic head, non tender sinuses. Lung - distant, no rales, rhonchi, wheezes. Heart - S1, S2 heard, no murmurs, rubs, trace pedal edema. Abdomen - Soft, non tender, obese, bowel sounds good Neuro - Alert, awake and oriented x 3, non focal exam. Skin - Warm and dry.  Data Reviewed:      Latest Ref Rng & Units 12/10/2023    6:13 AM 12/09/2023    1:49 PM 08/06/2022    7:08 AM  CBC  WBC 4.0 - 10.5 K/uL 8.1  10.3  22.6   Hemoglobin 12.0 - 15.0 g/dL 85.0  86.0  83.5   Hematocrit 36.0 - 46.0 % 46.3  43.3  50.0   Platelets 150 - 400 K/uL 328  324  402       Latest Ref Rng & Units 12/10/2023    6:13 AM 12/09/2023    1:49 PM 08/06/2022    7:08 AM  BMP  Glucose 70 - 99 mg/dL 851  871  831   BUN 6 - 20 mg/dL 15  12  17    Creatinine  0.44 - 1.00 mg/dL 8.95  9.12  8.77   Sodium 135 - 145 mmol/L 138  137  135   Potassium 3.5 - 5.1 mmol/L 4.8  4.0  3.7   Chloride 98 - 111 mmol/L 102  104  101   CO2 22 - 32 mmol/L 21  21  23    Calcium  8.9 - 10.3 mg/dL 9.8  9.1  9.5    MR CERVICAL SPINE W WO CONTRAST Result Date: 12/09/2023 CLINICAL DATA:  Demyelinating disease EXAM: MRI CERVICAL AND THORACIC SPINE WITHOUT AND WITH CONTRAST TECHNIQUE: Multiplanar and multiecho pulse sequences of the cervical spine, to include the craniocervical junction and cervicothoracic junction, and the thoracic spine, were obtained without and with intravenous contrast. CONTRAST:  10mL GADAVIST  GADOBUTROL  1 MMOL/ML IV SOLN COMPARISON:  None Available. FINDINGS: MRI CERVICAL SPINE FINDINGS  Alignment: No substantial sagittal subluxation. Vertebrae: No fracture, evidence of discitis, or bone lesion. Cord: Normal cord signal.  No abnormal enhancement. Posterior Fossa, vertebral arteries, paraspinal tissues: Negative. Disc levels: Mild disc bulging and facet arthropathy at multiple levels without significant canal or foraminal stenosis. MRI THORACIC SPINE FINDINGS Alignment:  Physiologic. Vertebrae: No fracture, evidence of discitis, or bone lesion. Cord:  Normal cord signal.  No abnormal enhancement. Paraspinal and other soft tissues: Negative. Disc levels: No significant canal or foraminal stenosis. IMPRESSION: 1. Normal cord signal. No evidence of demyelinating disease in the cervical or thoracic spine. 2. No significant canal or foraminal stenosis. Electronically Signed   By: Gilmore GORMAN Molt M.D.   On: 12/09/2023 22:46   MR THORACIC SPINE W WO CONTRAST Result Date: 12/09/2023 CLINICAL DATA:  Demyelinating disease EXAM: MRI CERVICAL AND THORACIC SPINE WITHOUT AND WITH CONTRAST TECHNIQUE: Multiplanar and multiecho pulse sequences of the cervical spine, to include the craniocervical junction and cervicothoracic junction, and the thoracic spine, were obtained without and with intravenous contrast. CONTRAST:  10mL GADAVIST  GADOBUTROL  1 MMOL/ML IV SOLN COMPARISON:  None Available. FINDINGS: MRI CERVICAL SPINE FINDINGS Alignment: No substantial sagittal subluxation. Vertebrae: No fracture, evidence of discitis, or bone lesion. Cord: Normal cord signal.  No abnormal enhancement. Posterior Fossa, vertebral arteries, paraspinal tissues: Negative. Disc levels: Mild disc bulging and facet arthropathy at multiple levels without significant canal or foraminal stenosis. MRI THORACIC SPINE FINDINGS Alignment:  Physiologic. Vertebrae: No fracture, evidence of discitis, or bone lesion. Cord:  Normal cord signal.  No abnormal enhancement. Paraspinal and other soft tissues: Negative. Disc levels: No significant  canal or foraminal stenosis. IMPRESSION: 1. Normal cord signal. No evidence of demyelinating disease in the cervical or thoracic spine. 2. No significant canal or foraminal stenosis. Electronically Signed   By: Gilmore GORMAN Molt M.D.   On: 12/09/2023 22:46    Family Communication: Discussed with patient, understand and agree. All questions answered.  Disposition: Status is: Inpatient Remains inpatient appropriate because: IV steorids, work up of optic neuritis.  Planned Discharge Destination: Home     Time spent: 40 minutes  Author: Concepcion Riser, MD 12/11/2023 4:57 PM Secure chat 7am to 7pm For on call review [email or web address].

## 2023-12-11 NOTE — OR Nursing (Addendum)
 As per Patient  BIPAP Setting Min epap  4 Max ipap 25 Pressure support 7  Notified Katie RT

## 2023-12-11 NOTE — Progress Notes (Cosign Needed)
 NEUROLOGY CONSULT FOLLOW UP NOTE   Date of service: December 11, 2023 Patient Name: Abigail Vega MRN:  [id number] DOB:  03-19-1976  Interval Hx/subjective   Patient reports slight improvement in her visual acuity and color perception.  She states the right eye medial visual field is the most severe but today she is able to differentiate shapes and rough colors including distinguishing between 1 and 2 fingers with the practitioner stood about 6-8 feet away and the right lateral visual field and less so in the right medial visual field.  With both eyes open she can correctly identify 1 or 2 fingers in all visual fields.  Yesterday she did notice some colorful light streaking that appeared to happen when looking at light sources.  Denies any pain with eye movement.  No new or worsening symptoms. Vitals   Vitals:   12/10/23 1817 12/10/23 2004 12/11/23 0025 12/11/23 0448  BP: 121/66 118/73 (!) 106/56 122/66  Pulse: 78 80 67 72  Resp: 16 17 17 17   Temp: 98 F (36.7 C) 97.8 F (36.6 C)  (!) 97.5 F (36.4 C)  TempSrc: Oral Oral  Oral  SpO2: 94% 94% 94% 96%     There is no height or weight on file to calculate BMI.  Physical Exam   Gen: patient lying in bed, NAD CV: extremities appear well-perfused Resp: normal WOB  Neurologic exam MS: alert, oriented x4, follows commands Speech: no dysarthria, no aphasia CN: Left pupil round and reactive to light, right pupil APD, VFF by confrontation with decreased visual acuity in the right eye (can differentiate 1 vs 2 fingers with provider at 6-8 feet away, better in the lateral visual field then medial), EOMI, sensation intact, face symmetric, hearing intact to voice Motor: 5/5 strength throughout Sensory: SILT Reflexes: 2+ symm with toes down bilat Coordination: FNF intact bilat Gait: deferred   Medications  Current Facility-Administered Medications:    acetaminophen  (TYLENOL ) tablet 650 mg, 650 mg, Oral, Q6H PRN, Sreeram, Narendranath, MD,  650 mg at 12/10/23 2026   albuterol  (PROVENTIL ) (2.5 MG/3ML) 0.083% nebulizer solution 2.5 mg, 2.5 mg, Nebulization, Q2H PRN, Sreeram, Narendranath, MD   ascorbic acid  (VITAMIN C ) tablet 1,500 mg, 1,500 mg, Oral, Daily, Sreeram, Narendranath, MD, 1,500 mg at 12/10/23 1048   brimonidine  (ALPHAGAN ) 0.15 % ophthalmic solution 1 drop, 1 drop, Both Eyes, BID, Sreeram, Narendranath, MD, 1 drop at 12/10/23 1954   buPROPion  (WELLBUTRIN  XL) 24 hr tablet 300 mg, 300 mg, Oral, Daily, Sreeram, Narendranath, MD, 300 mg at 12/10/23 1048   docusate sodium  (COLACE) capsule 100 mg, 100 mg, Oral, BID, Sreeram, Narendranath, MD, 100 mg at 12/10/23 2157   enoxaparin  (LOVENOX ) injection 40 mg, 40 mg, Subcutaneous, Q24H, Sreeram, Narendranath, MD, 40 mg at 12/10/23 2025   fluticasone  (FLONASE ) 50 MCG/ACT nasal spray 2 spray, 2 spray, Each Nare, Daily, Sreeram, Narendranath, MD   hydrALAZINE  (APRESOLINE ) injection 10 mg, 10 mg, Intravenous, Q6H PRN, Sreeram, Narendranath, MD   insulin  aspart (novoLOG ) injection 0-15 Units, 0-15 Units, Subcutaneous, TID WC, Sreeram, Narendranath, MD, 2 Units at 12/10/23 1819   insulin  aspart (novoLOG ) injection 0-5 Units, 0-5 Units, Subcutaneous, QHS, Sreeram, Narendranath, MD, 2 Units at 12/10/23 2223   loratadine  (CLARITIN ) tablet 10 mg, 10 mg, Oral, Daily, Sreeram, Narendranath, MD, 10 mg at 12/10/23 1049   losartan  (COZAAR ) tablet 25 mg, 25 mg, Oral, Daily, Sreeram, Narendranath, MD, 25 mg at 12/10/23 1049   methylPREDNISolone  sodium succinate (SOLU-MEDROL ) 1,000 mg in sodium chloride  0.9 % 50 mL  IVPB, 1,000 mg, Intravenous, Q24H, Sreeram, Narendranath, MD, Last Rate: 66 mL/hr at 12/10/23 1818, 1,000 mg at 12/10/23 1818   montelukast  (SINGULAIR ) tablet 10 mg, 10 mg, Oral, QPM, Sreeram, Narendranath, MD, 10 mg at 12/10/23 1819   ondansetron  (ZOFRAN ) tablet 4 mg, 4 mg, Oral, Q6H PRN **OR** ondansetron  (ZOFRAN ) injection 4 mg, 4 mg, Intravenous, Q6H PRN, Darci, Narendranath, MD    oxyCODONE  (Oxy IR/ROXICODONE ) immediate release tablet 5 mg, 5 mg, Oral, Q4H PRN, Sreeram, Narendranath, MD   pantoprazole  (PROTONIX ) EC tablet 40 mg, 40 mg, Oral, Daily, Khaliqdina, Salman, MD, 40 mg at 12/10/23 1053   rosuvastatin  (CRESTOR ) tablet 5 mg, 5 mg, Oral, Once per day on Monday Thursday, [place], [place], MD   senna (SENOKOT) tablet 17.2 mg, 2 tablet, Oral, QHS, Sreeram, Narendranath, MD, 17.2 mg at 12/10/23 2025   sertraline  (ZOLOFT ) tablet 25 mg, 25 mg, Oral, Daily, Sreeram, Narendranath, MD, 25 mg at 12/10/23 1049   spironolactone  (ALDACTONE ) tablet 25 mg, 25 mg, Oral, Daily, Sreeram, Narendranath, MD, 25 mg at 12/10/23 1052  Labs and Diagnostic Imaging   CBC:  Recent Labs  Lab 12/09/23 1349 12/10/23 0613  WBC 10.3 8.1  NEUTROABS 6.8  --   HGB 13.9 14.9  HCT 43.3 46.3*  MCV 91.2 91.7  PLT 324 328    Basic Metabolic Panel:  Lab Results  Component Value Date   NA 138 12/10/2023   K 4.8 12/10/2023   CO2 21 (L) 12/10/2023   GLUCOSE 148 (H) 12/10/2023   BUN 15 12/10/2023   CREATININE 1.04 (H) 12/10/2023   CALCIUM  9.8 12/10/2023   GFRNONAA >60 12/10/2023   Lipid Panel: No results found for: LDLCALC HgbA1c:  Lab Results  Component Value Date   HGBA1C 5.2 12/09/2023   Urine Drug Screen: No results found for: LABOPIA, COCAINSCRNUR, LABBENZ, AMPHETMU, THCU, LABBARB  Alcohol Level No results found for: ETH INR No results found for: INR APTT No results found for: APTT AED levels: No results found for: PHENYTOIN, ZONISAMIDE, LAMOTRIGINE, LEVETIRACETA  MR CERVICAL SPINE W WO CONTRAST MR THORACIC SPINE W WO CONTRAST Result Date: 12/09/2023 IMPRESSION: 1. Normal cord signal. No evidence of demyelinating disease in the cervical or thoracic spine. 2. No significant canal or foraminal stenosis. Electronically Signed   By: Gilmore GORMAN Molt M.D.   On: 12/09/2023 22:46   MR Brain W and Wo Contrast MR ORBITS W WO CONTRAST Result Date:  12/09/2023 IMPRESSION: MRI brain: 1. Unremarkable MRI appearance of the brain. No evidence of an acute intracranial abnormality. 2. 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 MRI orbits: 1. Prominent enhancement of the intraorbital right optic nerve and optic nerve sheath compatible with optic neuritis and perineuritis. 2. Left optic nerve atrophy. 3. Paranasal sinus disease as described. 4. 10 mm polypoid soft tissue focus within the right nasal passage. Direct visualization recommended. Electronically Signed   By: Rockey Childs D.O.   On: 12/09/2023 17:05   Assessment   Abigail Vega is a 48 y.o. female with HTN, HLD, T2DM, asthma, depression, and prior left optic neuritis in 2019 who presented with 1 week of right sided headache and blurry vision. MRI orbits showed prominent enhancement of the right optic nerve. MRI brain did not show any other significant abnormalities including MS lesions. She was started on IV steroids.  Follow-up MRI C/T-spine w/wo showed no evidence of demyelinating disorder.  With her comorbid conditions including hypertension, hyperlipidemia, and diabetes nonarteritic anterior ischemic optic neuropathy (NAION) is  still in the differential. Previously been on semaglutide and tirzepatide however both of these were over 1 year ago and her prior optic neuritis episode in 2019 preceded starting a GLP-1 agonist. One study showed increased risk of NAION while on semaglutide however subsequent larger study found no increased risk (Hathaway et al. 2024; Hsu et al. 2025).   Some slight improvement after second dose of IV steroids but overall stable without any new or worsening symptoms.  Overall stable since starting Solu-Medrol  the evening of 12/09/2023.  Will continue to treat as optic neuritis with 5 days of IV Solu-Medrol  1 g and since she has a follow-up with Dr. Skeet already scheduled we will hold off on having her follow-up with Dr. Vear although she  knows that Dr. Skeet may still send her to Dr. Vear in the future.   Recommendations  Continue IV Solu-Medrol  1 g every 24 hours for 5 days, day 3/5 NMO panel, MOG ab pending Has follow-up with Dr. Skeet so we will hold off on scheduling follow-up with Dr. Vear upon discharge Follow blood sugars closely while on steroids Continue pantoprazole  40 mg daily while on steroids ______________________________________________________________________   Bonney Fairy Pool, DO Internal Medicine Resident, PGY-3 7:23 AM 12/11/2023   Attending Neurohospitalist Addendum Patient seen and examined with APP/Resident. Agree with the history and physical as documented above. Agree with the plan as documented, which I helped formulate. I have edited the note above to reflect my full findings and recommendations. I have independently reviewed the chart, obtained history, review of systems and examined the patient.I have personally reviewed pertinent head/neck/spine imaging (CT/MRI). Please feel free to call with any questions.  -- Elida Ross, MD [facility] [phone]  If 7pm- 7am, please page neurology on call as listed in [facility].

## 2023-12-11 NOTE — Plan of Care (Signed)
   Problem: Coping: Goal: Ability to adjust to condition or change in health will improve Outcome: Progressing   Problem: Safety: Goal: Ability to remain free from injury will improve Outcome: Progressing

## 2023-12-11 NOTE — Plan of Care (Signed)
  Problem: Coping: Goal: Ability to adjust to condition or change in health will improve Outcome: Progressing   Problem: Health Behavior/Discharge Planning: Goal: Ability to manage health-related needs will improve Outcome: Progressing   Problem: Metabolic: Goal: Ability to maintain appropriate glucose levels will improve Outcome: Progressing   Problem: Nutritional: Goal: Maintenance of adequate nutrition will improve Outcome: Progressing   Problem: Education: Goal: Knowledge of General Education information will improve Description: Including pain rating scale, medication(s)/side effects and non-pharmacologic comfort measures Outcome: Progressing   Problem: Clinical Measurements: Goal: Will remain free from infection Outcome: Progressing Goal: Diagnostic test results will improve Outcome: Progressing

## 2023-12-12 DIAGNOSIS — E66813 Obesity, class 3: Secondary | ICD-10-CM | POA: Diagnosis not present

## 2023-12-12 DIAGNOSIS — H469 Unspecified optic neuritis: Secondary | ICD-10-CM | POA: Diagnosis not present

## 2023-12-12 DIAGNOSIS — I10 Essential (primary) hypertension: Secondary | ICD-10-CM | POA: Diagnosis not present

## 2023-12-12 DIAGNOSIS — G35 Multiple sclerosis: Secondary | ICD-10-CM | POA: Diagnosis not present

## 2023-12-12 DIAGNOSIS — E119 Type 2 diabetes mellitus without complications: Secondary | ICD-10-CM | POA: Diagnosis not present

## 2023-12-12 LAB — GLUCOSE, CAPILLARY
Glucose-Capillary: 149 mg/dL — ABNORMAL HIGH (ref 70–99)
Glucose-Capillary: 123 mg/dL — ABNORMAL HIGH (ref 70–99)
Glucose-Capillary: 101 mg/dL — ABNORMAL HIGH (ref 70–99)
Glucose-Capillary: 182 mg/dL — ABNORMAL HIGH (ref 70–99)

## 2023-12-12 LAB — NEUROMYELITIS OPTICA AUTOAB, IGG: NMO-IgG: <1.5 U/mL (ref 0.0–3.0)

## 2023-12-12 MED ORDER — SODIUM CHLORIDE 0.9 % IV SOLN
1000.0000 mg | Freq: Once | INTRAVENOUS | Status: AC
  Administered 2023-12-13: 1000 mg via INTRAVENOUS
  Filled 2023-12-12: qty 16

## 2023-12-12 MED ORDER — ENOXAPARIN SODIUM 40 MG/0.4ML IJ SOSY
40.0000 mg | PREFILLED_SYRINGE | Freq: Two times a day (BID) | INTRAMUSCULAR | Status: DC
  Administered 2023-12-12: 40 mg via SUBCUTANEOUS
  Filled 2023-12-12 (×2): qty 0.4

## 2023-12-12 MED ORDER — SODIUM CHLORIDE 0.9 % IV SOLN
1000.0000 mg | INTRAVENOUS | Status: AC
  Administered 2023-12-12: 1000 mg via INTRAVENOUS
  Filled 2023-12-12: qty 16

## 2023-12-12 NOTE — Progress Notes (Signed)
   12/12/23 2103  BiPAP/CPAP/SIPAP  $ Non-Invasive Ventilator  Non-Invasive Vent Subsequent  BiPAP/CPAP/SIPAP Pt Type Adult  BiPAP/CPAP/SIPAP Resmed  Mask Type Full face mask  Mask Size Medium  IPAP 25 cmH20  EPAP 4 cmH2O  FiO2 (%) 21 %  Patient Home Machine No  Patient Home Mask Yes  Patient Home Tubing Yes  CPAP/SIPAP surface wiped down Yes  Device Plugged into RED Power Outlet Yes  BiPAP/CPAP /SiPAP Vitals  Pulse Rate 89  Resp 16  SpO2 97 %  Bilateral Breath Sounds Clear;Diminished  MEWS Score/Color  MEWS Score 0  MEWS Score Color Landy

## 2023-12-12 NOTE — Progress Notes (Signed)
 Progress Note   Patient: Abigail Vega FMW:[id number] DOB: 09-27-1975 DOA: 12/09/2023     3 DOS: the patient was seen and examined on 12/12/2023   Brief hospital course: Abigail O Kady is a 48 y.o. female with medical history significant of hypertension, hyperlipidemia, type 2 diabetes mellitus, asthma, seasonal allergies, depression presented from outpatient eye clinic for evaluation of right-sided headaches, visual changes.  MRI of brain, orbits consistent with right-sided optic neuritis, Neurology advised high dose steroids. She is started on 1gm IV solu medrol  for 5 days. Her right eye pain and vision gradually improved.   Assessment and Plan: Right-sided optic neuritis: Possible MS- Right eye visual changes persist, but headache, eye pain better. Neurology follow up appreciated. Continue IV Solu-Medrol  1 g every 24 hours for total 5 days. Today is day #4. Discharge plan for tomorrow after last dose of IV steroid. Advised to follow up with neurology, eye doctor and PCP as instructed.   Hypertension: BP stable, continue losartan .   Type 2 diabetes mellitus: Hold metformin, Januvia. Well controlled A1C 5.2.  Sugars high as she is on IV steroids, continue Accu-Cheks, sliding scale insulin  per protocol.   Depression: Continue sertraline , Wellbutrin .   Hyperlipidemia: Continue home dose statin.   Obesity class 3 BMI 46. Counseled regarding diet, weight reduction. OSA-CPAP at night ordered.     Out of bed to chair. Incentive spirometry. Nursing supportive care. Fall, aspiration precautions. Diet:  Diet Orders (From admission, onward)     Start     Ordered   12/09/23 1736  Diet heart healthy/carb modified Room service appropriate? Yes; Fluid consistency: Thin  Diet effective now       Question Answer Comment  Diet-HS Snack? Nothing   Room service appropriate? Yes   Fluid consistency: Thin      12/09/23 1736           DVT prophylaxis: enoxaparin  (LOVENOX )  injection 40 mg Start: 12/12/23 1315 SCDs Start: 12/09/23 1735  Level of care: Med-Surg   Code Status: Full Code  Subjective: Patient is seen and examined today morning. Sitting in chair. States her right eye vision much better today, still hazy but able to see. Denies any other complaints.  Physical Exam: Vitals:   12/11/23 2038 12/12/23 0455 12/12/23 1013 12/12/23 1649  BP: 119/67 125/63 128/74 131/77  Pulse: 72 88 80 68  Resp: 16 16 16 17   Temp: 97.7 F (36.5 C) 98 F (36.7 C) 97.7 F (36.5 C) 97.9 F (36.6 C)  TempSrc: Oral  Oral Oral  SpO2: 95% 96% 95% 95%  Weight:   (!) 138 kg   Height:   5' 8 (1.727 m)     General -   Middle aged obese Caucasian obese female, no distress HEENT - PERRLA, EOMI, atraumatic head, non tender sinuses. Lung - distant, no rales, rhonchi, wheezes. Heart - S1, S2 heard, no murmurs, rubs, trace pedal edema. Abdomen - Soft, non tender, obese, bowel sounds good Neuro - Alert, awake and oriented x 3, non focal exam. Skin - Warm and dry.  Data Reviewed:      Latest Ref Rng & Units 12/10/2023    6:13 AM 12/09/2023    1:49 PM 08/06/2022    7:08 AM  CBC  WBC 4.0 - 10.5 K/uL 8.1  10.3  22.6   Hemoglobin 12.0 - 15.0 g/dL 85.0  86.0  83.5   Hematocrit 36.0 - 46.0 % 46.3  43.3  50.0   Platelets 150 - 400 K/uL  328  324  402       Latest Ref Rng & Units 12/10/2023    6:13 AM 12/09/2023    1:49 PM 08/06/2022    7:08 AM  BMP  Glucose 70 - 99 mg/dL 851  871  831   BUN 6 - 20 mg/dL 15  12  17    Creatinine 0.44 - 1.00 mg/dL 8.95  9.12  8.77   Sodium 135 - 145 mmol/L 138  137  135   Potassium 3.5 - 5.1 mmol/L 4.8  4.0  3.7   Chloride 98 - 111 mmol/L 102  104  101   CO2 22 - 32 mmol/L 21  21  23    Calcium  8.9 - 10.3 mg/dL 9.8  9.1  9.5    No results found.   Family Communication: Discussed with patient, she understand and agree. All questions answered.  Disposition: Status is: Inpatient Remains inpatient appropriate because: IV steoroids for 5  days.  Planned Discharge Destination: Home     Time spent: 39 minutes  Author: Concepcion Riser, MD 12/12/2023 4:58 PM Secure chat 7am to 7pm For on call review [email or web address].

## 2023-12-12 NOTE — Progress Notes (Addendum)
 NEUROLOGY CONSULT FOLLOW UP NOTE   Date of service: December 12, 2023 Patient Name: Abigail Vega MRN:  [id number] DOB:  Sep 05, 1975  Interval Hx/subjective   She has noticed some continued improvement today and feels about 50% back to full vision.  We had a long discussion with the patient and her parents about the diagnosis of MS, treatment plans, and what to expect.  During this she did note that she has some numbness in her lower extremities primarily her feet. Vitals   Vitals:   12/11/23 0936 12/11/23 1654 12/11/23 2038 12/12/23 0455  BP: 123/68 119/71 119/67 125/63  Pulse: 72 67 72 88  Resp: 17 17 16 16   Temp:  (!) 97.5 F (36.4 C) 97.7 F (36.5 C) 98 F (36.7 C)  TempSrc:  Oral Oral   SpO2: 95% 95% 95% 96%     There is no height or weight on file to calculate BMI.  Physical Exam   Gen: patient lying in bed, NAD CV: extremities appear well-perfused Resp: normal WOB  Neurologic exam MS: alert, oriented x4, follows commands Speech: no dysarthria, no aphasia CN: Left pupil round and reactive to light, right pupil APD, VFF by confrontation with decreased visual acuity in the right eye (can differentiate 1 vs 2 fingers with provider at 6-8 feet away, better in the lateral visual field then medial), EOMI, sensation intact, face symmetric, hearing intact to voice Motor: 5/5 strength throughout Sensory: Decreased sensation to pinprick on the plantar surfaces of her feet right greater than left Gait: deferred   Medications  Current Facility-Administered Medications:    acetaminophen  (TYLENOL ) tablet 650 mg, 650 mg, Oral, Q6H PRN, Sreeram, Narendranath, MD, 650 mg at 12/11/23 2136   albuterol  (PROVENTIL ) (2.5 MG/3ML) 0.083% nebulizer solution 2.5 mg, 2.5 mg, Nebulization, Q2H PRN, Sreeram, Narendranath, MD   ascorbic acid  (VITAMIN C ) tablet 1,500 mg, 1,500 mg, Oral, Daily, Sreeram, Narendranath, MD, 1,500 mg at 12/11/23 0930   brimonidine  (ALPHAGAN ) 0.15 % ophthalmic solution 1  drop, 1 drop, Both Eyes, BID, Sreeram, Narendranath, MD, 1 drop at 12/11/23 2139   buPROPion  (WELLBUTRIN  XL) 24 hr tablet 300 mg, 300 mg, Oral, Daily, Sreeram, Narendranath, MD, 300 mg at 12/11/23 0930   docusate sodium  (COLACE) capsule 100 mg, 100 mg, Oral, BID, Sreeram, Narendranath, MD, 100 mg at 12/11/23 2136   enoxaparin  (LOVENOX ) injection 40 mg, 40 mg, Subcutaneous, Q24H, Sreeram, Narendranath, MD, 40 mg at 12/11/23 1713   fluticasone  (FLONASE ) 50 MCG/ACT nasal spray 2 spray, 2 spray, Each Nare, Daily, Sreeram, Narendranath, MD, 2 spray at 12/11/23 0932   hydrALAZINE  (APRESOLINE ) injection 10 mg, 10 mg, Intravenous, Q6H PRN, Sreeram, Narendranath, MD   insulin  aspart (novoLOG ) injection 0-15 Units, 0-15 Units, Subcutaneous, TID WC, Sreeram, Narendranath, MD, 2 Units at 12/11/23 1219   insulin  aspart (novoLOG ) injection 0-5 Units, 0-5 Units, Subcutaneous, QHS, Sreeram, Narendranath, MD, 2 Units at 12/10/23 2223   loratadine  (CLARITIN ) tablet 10 mg, 10 mg, Oral, Daily, Sreeram, Narendranath, MD, 10 mg at 12/11/23 0930   losartan  (COZAAR ) tablet 25 mg, 25 mg, Oral, Daily, Sreeram, Narendranath, MD, 25 mg at 12/11/23 0930   methylPREDNISolone  sodium succinate (SOLU-MEDROL ) 1,000 mg in sodium chloride  0.9 % 50 mL IVPB, 1,000 mg, Intravenous, Q24H, Jolaine Pac, DO, Last Rate: 66 mL/hr at 12/11/23 1710, 1,000 mg at 12/11/23 1710   montelukast  (SINGULAIR ) tablet 10 mg, 10 mg, Oral, QPM, Sreeram, Narendranath, MD, 10 mg at 12/11/23 1713   ondansetron  (ZOFRAN ) tablet 4 mg, 4 mg, Oral, Q6H  PRN **OR** ondansetron  (ZOFRAN ) injection 4 mg, 4 mg, Intravenous, Q6H PRN, Darci, Narendranath, MD   oxyCODONE  (Oxy IR/ROXICODONE ) immediate release tablet 5 mg, 5 mg, Oral, Q4H PRN, Darci Pore, MD   pantoprazole  (PROTONIX ) EC tablet 40 mg, 40 mg, Oral, Daily, Khaliqdina, Salman, MD, 40 mg at 12/11/23 0930   rosuvastatin  (CRESTOR ) tablet 5 mg, 5 mg, Oral, Once per day on Monday Thursday, [place],  [place], MD   senna [facility]) tablet 17.2 mg, 2 tablet, Oral, QHS, Sreeram, Narendranath, MD, 17.2 mg at 12/11/23 2136   sertraline  (ZOLOFT ) tablet 25 mg, 25 mg, Oral, Daily, Sreeram, Narendranath, MD, 25 mg at 12/11/23 0931   spironolactone  (ALDACTONE ) tablet 25 mg, 25 mg, Oral, Daily, Sreeram, Narendranath, MD, 25 mg at 12/11/23 0930  Labs and Diagnostic Imaging   CBC:  Recent Labs  Lab 12/09/23 1349 12/10/23 0613  WBC 10.3 8.1  NEUTROABS 6.8  --   HGB 13.9 14.9  HCT 43.3 46.3*  MCV 91.2 91.7  PLT 324 328    Basic Metabolic Panel:  Lab Results  Component Value Date   NA 138 12/10/2023   K 4.8 12/10/2023   CO2 21 (L) 12/10/2023   GLUCOSE 148 (H) 12/10/2023   BUN 15 12/10/2023   CREATININE 1.04 (H) 12/10/2023   CALCIUM  9.8 12/10/2023   GFRNONAA >60 12/10/2023   Lipid Panel: No results found for: LDLCALC HgbA1c:  Lab Results  Component Value Date   HGBA1C 5.2 12/09/2023   Urine Drug Screen: No results found for: LABOPIA, COCAINSCRNUR, LABBENZ, AMPHETMU, THCU, LABBARB  Alcohol Level No results found for: ETH INR No results found for: INR APTT No results found for: APTT AED levels: No results found for: PHENYTOIN, ZONISAMIDE, LAMOTRIGINE, LEVETIRACETA  MR CERVICAL SPINE W WO CONTRAST MR THORACIC SPINE W WO CONTRAST Result Date: 12/09/2023 IMPRESSION: 1. Normal cord signal. No evidence of demyelinating disease in the cervical or thoracic spine. 2. No significant canal or foraminal stenosis. Electronically Signed   By: Gilmore GORMAN Molt M.D.   On: 12/09/2023 22:46   MR Brain W and Wo Contrast MR ORBITS W WO CONTRAST Result Date: 12/09/2023 IMPRESSION: MRI brain: 1. Unremarkable MRI appearance of the brain. No evidence of an acute intracranial abnormality. 2. 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 MRI orbits: 1. Prominent enhancement of the intraorbital right optic nerve and optic nerve sheath compatible  with optic neuritis and perineuritis. 2. Left optic nerve atrophy. 3. Paranasal sinus disease as described. 4. 10 mm polypoid soft tissue focus within the right nasal passage. Direct visualization recommended. Electronically Signed   By: Rockey Childs D.O.   On: 12/09/2023 17:05   Assessment   Abigail Vega is a 48 y.o. female with HTN, HLD, T2DM, asthma, depression, and prior left optic neuritis in 2019 who presented with 1 week of right sided headache and blurry vision. MRI orbits showed prominent enhancement of the right optic nerve and left optic nerve atrophy. MRI brain did not show any other significant abnormalities. She was started on IV steroids.  Follow-up MRI C/T-spine w/wo showed no evidence of demyelinating disorder in the brain parenchyma or C/T-spine. With her comorbid conditions including hypertension, hyperlipidemia, and diabetes nonarteritic anterior ischemic optic neuropathy (NAION) was considered. Previously been on semaglutide and tirzepatide however both of these were over 1 year ago and her prior optic neuritis episode in 2019 preceded starting a GLP-1 agonist. One study showed increased risk of NAION while on semaglutide however subsequent larger study  found no increased risk (Hathaway et al. 2024; Hsu et al. 2025).  Overall she has 2 episodes of optic neuritis each in a different optic nerve which meets the criteria for multiple sclerosis.  Also with her moderate improvement after starting steroids this also supports optic neuritis and is much less likely to be ischemic.  She continues to have improvement and feels like she is 50% back to normal with vision in her right eye.  We had a long discussion today about her diagnosis of multiple sclerosis with the patient and her parents.  She is currently being treated for depression with sertraline  and bupropion  but does not see a therapist or psychiatrist.  Discussed the importance of adding a therapist and/or psychiatrist to her treatment  team especially with her new MS diagnosis.  Also discussed the importance of seeing an MS specialist and starting on a medication.  She currently has an appointment with Dr. Skeet on 12/17/2023 but we will send referral to have her follow-up with Dr. Vear instead.  We will continue to treat her optic neuritis now characterized as an MS flare with 5 days of IV steroids.  She is currently on day 4 and we will time the dose tomorrow to allow her to discharge before the evening.  Recommendations  Continue IV Solu-Medrol  1 g every 24 hours for 5 days, day 4/5 Final steroid dose will be timed at 10 AM and patient may be discharged following this dose. Neurology does not need to see the patient prior to discharge  NMO panel, MOG ab pending Has follow-up with Dr. Skeet on 12/17/2023 which she may cancel Referral placed to follow-up with Dr. Vear within the next 2 weeks Continue pantoprazole  40 mg daily while on steroids Recommend no driving until follow-up with Dr. Vear TSH, vitamin B12, and folate to evaluate peripheral neuropathy ______________________________________________________________________   Bonney Fairy Pool, DO Internal Medicine Resident, PGY-3 7:35 AM 12/12/2023   Attending Neurohospitalist Addendum Patient seen and examined with APP/Resident. Agree with the history and physical as documented above. Agree with the plan as documented, which I helped formulate. I have edited the note above to reflect my full findings and recommendations. I have independently reviewed the chart, obtained history, review of systems and examined the patient.I have personally reviewed pertinent head/neck/spine imaging (CT/MRI). Please feel free to call with any questions.  -- Elida Ross, MD [facility] [phone]  If 7pm- 7am, please page neurology on call as listed in [facility].

## 2023-12-12 NOTE — Plan of Care (Signed)
  Problem: Coping: Goal: Ability to adjust to condition or change in health will improve Outcome: Progressing   Problem: Health Behavior/Discharge Planning: Goal: Ability to identify and utilize available resources and services will improve Outcome: Progressing   Problem: Health Behavior/Discharge Planning: Goal: Ability to manage health-related needs will improve Outcome: Progressing   

## 2023-12-12 NOTE — Plan of Care (Signed)
   Problem: Education: Goal: Ability to describe self-care measures that may prevent or decrease complications (Diabetes Survival Skills Education) will improve Outcome: Progressing Goal: Individualized Educational Video(s) Outcome: Progressing

## 2023-12-13 DIAGNOSIS — E66813 Obesity, class 3: Secondary | ICD-10-CM | POA: Diagnosis not present

## 2023-12-13 DIAGNOSIS — G35 Multiple sclerosis: Secondary | ICD-10-CM

## 2023-12-13 DIAGNOSIS — E119 Type 2 diabetes mellitus without complications: Secondary | ICD-10-CM | POA: Diagnosis not present

## 2023-12-13 DIAGNOSIS — H469 Unspecified optic neuritis: Secondary | ICD-10-CM | POA: Diagnosis not present

## 2023-12-13 LAB — BASIC METABOLIC PANEL WITH GFR
Anion gap: 10 (ref 5–15)
BUN: 18 mg/dL (ref 6–20)
CO2: 26 mmol/L (ref 22–32)
Calcium: 9.5 mg/dL (ref 8.9–10.3)
Chloride: 102 mmol/L (ref 98–111)
Creatinine, Ser: 1.07 mg/dL — ABNORMAL HIGH (ref 0.44–1.00)
GFR, Estimated: >60 mL/min (ref >60)
Glucose, Bld: 155 mg/dL — ABNORMAL HIGH (ref 70–99)
Potassium: 4.7 mmol/L (ref 3.5–5.1)
Sodium: 138 mmol/L (ref 135–145)

## 2023-12-13 LAB — CBC
HCT: 42.5 % (ref 36.0–46.0)
Hemoglobin: 13.8 g/dL (ref 12.0–15.0)
MCH: 29.2 pg (ref 26.0–34.0)
MCHC: 32.5 g/dL (ref 30.0–36.0)
MCV: 89.9 fL (ref 80.0–100.0)
Platelets: 294 K/uL (ref 150–400)
RBC: 4.73 MIL/uL (ref 3.87–5.11)
RDW: 13.5 % (ref 11.5–15.5)
WBC: 8.3 K/uL (ref 4.0–10.5)
nRBC: 0 % (ref 0.0–0.2)

## 2023-12-13 LAB — FOLATE: Folate: 10.7 ng/mL (ref >5.9)

## 2023-12-13 LAB — TSH: TSH: 0.735 u[IU]/mL (ref 0.350–4.500)

## 2023-12-13 LAB — GLUCOSE, CAPILLARY: Glucose-Capillary: 137 mg/dL — ABNORMAL HIGH (ref 70–99)

## 2023-12-13 LAB — VITAMIN B12: Vitamin B-12: 333 pg/mL (ref 180–914)

## 2023-12-13 NOTE — Plan of Care (Signed)
   Problem: Coping: Goal: Ability to adjust to condition or change in health will improve Outcome: Progressing

## 2023-12-13 NOTE — Discharge Summary (Incomplete)
 Physician Discharge Summary   Patient: Abigail Vega MRN: [id number] DOB: [birth year]  Admit date:     12/09/2023  Discharge date: {dischdate:26783}  Discharge Physician: Concepcion Riser   PCP: Okey Carlin Redbird, MD   Recommendations at discharge:  {Tip this will not be part of the note when signed- Example include specific recommendations for outpatient follow-up, pending tests to follow-up on. (Optional):26781}  ***  Discharge Diagnoses: Principal Problem:   Right optic neuritis  Resolved Problems:   * No resolved hospital problems. [facility] Course: No notes on file  Assessment and Plan: No notes have been filed under this hospital service. Service: Hospitalist     {Tip this will not be part of the note when signed Body mass index is 46.26 kg/m. , ,  (Optional):26781}  {(NOTE) Pain control PDMP Statment (Optional):26782} Consultants: *** Procedures performed: ***  Disposition: {Plan; Disposition:26390} Diet recommendation:  Discharge Diet Orders (From admission, onward)     Start     Ordered   12/13/23 0000  Diet - low sodium heart healthy        12/13/23 1002   12/13/23 0000  Diet Carb Modified        12/13/23 1002           {Diet_Plan:26776} DISCHARGE MEDICATION: Allergies as of 12/13/2023       Reactions   Ozempic (0.25 Or 0.5 Mg-dose) [semaglutide(0.25 Or 0.5mg -dos)] Nausea And Vomiting, Other (See Comments)   Dehydration secondary to vomiting, required hospitalization   Other Cough   Tree, molds, cat, dogs , mites Reports multiple environmental allergies        Medication List     TAKE these medications    ADVIL  DUAL ACTION PO Take 2 tablets by mouth 2 (two) times daily as needed (headache, pain).   albuterol  108 (90 Base) MCG/ACT inhaler Commonly known as: VENTOLIN  HFA Inhale 2 puffs into the lungs every 6 (six) hours as needed for wheezing or shortness of breath.   Auvi-Q 0.3 mg/0.3 mL Soaj injection Generic drug:  EPINEPHrine Inject 0.3 mg into the muscle as needed for anaphylaxis.   Blisovi Fe 1.5/30 1.5-30 MG-MCG tablet Generic drug: norethindrone-ethinyl estradiol-iron Take 1 tablet by mouth daily.   buPROPion  150 MG 24 hr tablet Commonly known as: WELLBUTRIN  XL Take 300 mg by mouth daily.   cetirizine 10 MG tablet Commonly known as: ZYRTEC Take 10 mg by mouth daily.   Fish Oil  1000 MG Caps Take 2,000 mg by mouth daily.   fluticasone  50 MCG/ACT nasal spray Commonly known as: FLONASE  Place 1-2 sprays into both nostrils daily as needed for rhinitis.   losartan  25 MG tablet Commonly known as: COZAAR  Take 1 tablet by mouth daily.   metFORMIN 500 MG tablet Commonly known as: GLUCOPHAGE Take 1,000 mg by mouth daily with breakfast.   montelukast  10 MG tablet Commonly known as: SINGULAIR  Take 10 mg by mouth daily.   rosuvastatin  5 MG tablet Commonly known as: CRESTOR  Take 5 mg by mouth See admin instructions. Take 1 tablet (5mg ) by mouth in the mornings, twice a week on Sunday and Wednesday.   sertraline  25 MG tablet Commonly known as: ZOLOFT  Take 25 mg by mouth daily.   spironolactone  25 MG tablet Commonly known as: ALDACTONE  Take 25 mg by mouth daily.   SUMAtriptan  100 MG tablet Commonly known as: IMITREX  Take 100 mg by mouth See admin instructions. Take 1 tablet (100mg ) by mouth once daily as needed for migraine, may repeat in 2  hours if headache persists or recurs. Not to exceed 2 tablets (200mg ) in 24 hours.   UNABLE TO FIND Inject 1 Dose into the skin See admin instructions. Allergy injections; 1 injection subcutaneously every week until maintenance dose is reached, then 1 injection every 3 weeks.   VITAMIN C  PO Take 3,000 mg by mouth daily. OTC Liposomal Vitamin C  1500mg  capsules.   Vitamin D3 50 MCG (2000 UT) capsule Take 2,000 Units by mouth daily.        Follow-up Information     Sater, Charlie LABOR, MD. Call.   Specialty: Neurology Why: If you have not heard  from the clinic within 1 week Contact information: [address] [place] [address] [phone]                Discharge Exam: [name] Weights   12/12/23 1013  Weight: (!) 138 kg   ***  Condition at discharge: {DC Condition:26389}  The results of significant diagnostics from this hospitalization (including imaging, microbiology, ancillary and laboratory) are listed below for reference.   Imaging Studies: MR CERVICAL SPINE W WO CONTRAST Result Date: 12/09/2023 CLINICAL DATA:  Demyelinating disease EXAM: MRI CERVICAL AND THORACIC SPINE WITHOUT AND WITH CONTRAST TECHNIQUE: Multiplanar and multiecho pulse sequences of the cervical spine, to include the craniocervical junction and cervicothoracic junction, and the thoracic spine, were obtained without and with intravenous contrast. CONTRAST:  10mL GADAVIST  GADOBUTROL  1 MMOL/ML IV SOLN COMPARISON:  None Available. FINDINGS: MRI CERVICAL SPINE FINDINGS Alignment: No substantial sagittal subluxation. Vertebrae: No fracture, evidence of discitis, or bone lesion. Cord: Normal cord signal.  No abnormal enhancement. Posterior Fossa, vertebral arteries, paraspinal tissues: Negative. Disc levels: Mild disc bulging and facet arthropathy at multiple levels without significant canal or foraminal stenosis. MRI THORACIC SPINE FINDINGS Alignment:  Physiologic. Vertebrae: No fracture, evidence of discitis, or bone lesion. Cord:  Normal cord signal.  No abnormal enhancement. Paraspinal and other soft tissues: Negative. Disc levels: No significant canal or foraminal stenosis. IMPRESSION: 1. Normal cord signal. No evidence of demyelinating disease in the cervical or thoracic spine. 2. No significant canal or foraminal stenosis. Electronically Signed   By: Gilmore GORMAN Molt M.D.   On: 12/09/2023 22:46   MR THORACIC SPINE W WO CONTRAST Result Date: 12/09/2023 CLINICAL DATA:  Demyelinating disease EXAM: MRI CERVICAL AND THORACIC SPINE WITHOUT AND WITH  CONTRAST TECHNIQUE: Multiplanar and multiecho pulse sequences of the cervical spine, to include the craniocervical junction and cervicothoracic junction, and the thoracic spine, were obtained without and with intravenous contrast. CONTRAST:  10mL GADAVIST  GADOBUTROL  1 MMOL/ML IV SOLN COMPARISON:  None Available. FINDINGS: MRI CERVICAL SPINE FINDINGS Alignment: No substantial sagittal subluxation. Vertebrae: No fracture, evidence of discitis, or bone lesion. Cord: Normal cord signal.  No abnormal enhancement. Posterior Fossa, vertebral arteries, paraspinal tissues: Negative. Disc levels: Mild disc bulging and facet arthropathy at multiple levels without significant canal or foraminal stenosis. MRI THORACIC SPINE FINDINGS Alignment:  Physiologic. Vertebrae: No fracture, evidence of discitis, or bone lesion. Cord:  Normal cord signal.  No abnormal enhancement. Paraspinal and other soft tissues: Negative. Disc levels: No significant canal or foraminal stenosis. IMPRESSION: 1. Normal cord signal. No evidence of demyelinating disease in the cervical or thoracic spine. 2. No significant canal or foraminal stenosis. Electronically Signed   By: Gilmore GORMAN Molt M.D.   On: 12/09/2023 22:46   MR Brain W and Wo Contrast Result Date: 12/09/2023 CLINICAL DATA:  Provided history: Vision loss, monocular. Additional history provided: Headache. Concern for optic neuritis  on right. History of optic neuritis on left (2019). EXAM: MRI HEAD AND ORBITS WITHOUT AND WITH CONTRAST TECHNIQUE: Multiplanar, multiecho pulse sequences of the brain and surrounding structures were obtained without and with intravenous contrast. Multiplanar, multiecho pulse sequences of the orbits and surrounding structures were obtained including fat saturation techniques, before and after intravenous contrast administration. CONTRAST:  10mL GADAVIST  GADOBUTROL  1 MMOL/ML IV SOLN COMPARISON:  MRI orbits 11/03/2017 (images available, report unavailable).  FINDINGS: MRI HEAD FINDINGS Brain: Cerebral volume is normal. No cortical encephalomalacia is identified. No significant cerebral white matter disease. There is no acute infarct. No evidence of an intracranial mass. No extra-axial fluid collection. No midline shift. No pathologic intracranial enhancement identified. Vascular: Maintained flow voids within the proximal large arterial vessels. 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 Skull and upper cervical spine: No focal worrisome marrow lesion. MRI ORBITS FINDINGS Orbits: Prominent enhancement of the intraorbital right optic nerve and optic nerve sheath. Left optic nerve atrophy. The globes are normal in size and contour. The extraocular muscles and lacrimal glands are symmetric and unremarkable. No pathologic enhancement within the left orbit. Visualized sinuses: Postoperative appearance of the paranasal sinuses. Mucous retention cysts measuring up to 11 mm, and trace background mucosal thickening, within the left maxillary sinus. Soft tissues: The visible maxillofacial and upper neck soft tissues are unremarkable. Other: 10 mm polypoid soft tissue focus within the right nasal passage. IMPRESSION: MRI brain: 1. Unremarkable MRI appearance of the brain. No evidence of an acute intracranial abnormality. 2. 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 MRI orbits: 1. Prominent enhancement of the intraorbital right optic nerve and optic nerve sheath compatible with optic neuritis and perineuritis. 2. Left optic nerve atrophy. 3. Paranasal sinus disease as described. 4. 10 mm polypoid soft tissue focus within the right nasal passage. Direct visualization recommended. Electronically Signed   By: Rockey Childs D.O.   On: 12/09/2023 17:05   MR ORBITS W WO CONTRAST Result Date: 12/09/2023 CLINICAL DATA:  Provided history: Vision loss, monocular. Additional history provided:  Headache. Concern for optic neuritis on right. History of optic neuritis on left (2019). EXAM: MRI HEAD AND ORBITS WITHOUT AND WITH CONTRAST TECHNIQUE: Multiplanar, multiecho pulse sequences of the brain and surrounding structures were obtained without and with intravenous contrast. Multiplanar, multiecho pulse sequences of the orbits and surrounding structures were obtained including fat saturation techniques, before and after intravenous contrast administration. CONTRAST:  10mL GADAVIST  GADOBUTROL  1 MMOL/ML IV SOLN COMPARISON:  MRI orbits 11/03/2017 (images available, report unavailable). FINDINGS: MRI HEAD FINDINGS Brain: Cerebral volume is normal. No cortical encephalomalacia is identified. No significant cerebral white matter disease. There is no acute infarct. No evidence of an intracranial mass. No extra-axial fluid collection. No midline shift. No pathologic intracranial enhancement identified. Vascular: Maintained flow voids within the proximal large arterial vessels. 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 Skull and upper cervical spine: No focal worrisome marrow lesion. MRI ORBITS FINDINGS Orbits: Prominent enhancement of the intraorbital right optic nerve and optic nerve sheath. Left optic nerve atrophy. The globes are normal in size and contour. The extraocular muscles and lacrimal glands are symmetric and unremarkable. No pathologic enhancement within the left orbit. Visualized sinuses: Postoperative appearance of the paranasal sinuses. Mucous retention cysts measuring up to 11 mm, and trace background mucosal thickening, within the left maxillary sinus. Soft tissues:  The visible maxillofacial and upper neck soft tissues are unremarkable. Other: 10 mm polypoid soft tissue focus within the right nasal passage. IMPRESSION: MRI brain: 1. Unremarkable MRI appearance of the brain. No evidence of an acute intracranial abnormality. 2. Left sigmoid  sinus diverticulum measuring 2.1 x 0.6 cm (anatomic variant). MRI orbits: 1. Prominent enhancement of the intraorbital right optic nerve and optic nerve sheath compatible with optic neuritis and perineuritis. 2. Left optic nerve atrophy. 3. Paranasal sinus disease as described. 4. 10 mm polypoid soft tissue focus within the right nasal passage. Direct visualization recommended. Electronically Signed   By: Rockey Childs D.O.   On: 12/09/2023 17:05    Microbiology: Results for orders placed or performed during the hospital encounter of 08/06/22  Blood culture (routine x 2)     Status: None   Collection Time: 08/06/22 10:54 AM   Specimen: BLOOD  Result Value Ref Range Status   Specimen Description   Final    BLOOD RIGHT ANTECUBITAL Performed at [facility], [address] [place], [place] [address]    Special Requests   Final    BOTTLES DRAWN AEROBIC AND ANAEROBIC Blood Culture adequate volume Performed at [facility], [address] [place], [place] [address]    Culture   Final    NO GROWTH 5 DAYS Performed at [facility], [address], [place], [place] [address]    Report Status 08/11/2022 FINAL  Final  Blood culture (routine x 2)     Status: None   Collection Time: 08/06/22 11:10 AM   Specimen: BLOOD LEFT HAND  Result Value Ref Range Status   Specimen Description   Final    BLOOD LEFT HAND Performed at [facility], [address] [place], [place] [address]    Special Requests   Final    BOTTLES DRAWN AEROBIC AND ANAEROBIC Blood Culture results may not be optimal due to an inadequate volume of blood received in culture bottles Performed at [facility], [address] [place], [place] [address]    Culture   Final    NO GROWTH 5 DAYS Performed at [facility], [address] [place], [place] [address]    Report Status 08/11/2022 FINAL  Final  Gastrointestinal Panel by PCR , Stool     Status: None   Collection Time:  08/11/22 10:10 AM  Result Value Ref Range Status   Campylobacter species NOT DETECTED NOT DETECTED Final   Plesimonas shigelloides NOT DETECTED NOT DETECTED Final   Salmonella species NOT DETECTED NOT DETECTED Final   Yersinia enterocolitica NOT DETECTED NOT DETECTED Final   Vibrio species NOT DETECTED NOT DETECTED Final   Vibrio cholerae NOT DETECTED NOT DETECTED Final   Enteroaggregative E coli (EAEC) NOT DETECTED NOT DETECTED Final   Enteropathogenic E coli (EPEC) NOT DETECTED NOT DETECTED Final   Enterotoxigenic E coli (ETEC) NOT DETECTED NOT DETECTED Final   Shiga like toxin producing E coli (STEC) NOT DETECTED NOT DETECTED Final   Shigella/Enteroinvasive E coli (EIEC) NOT DETECTED NOT DETECTED Final   Cryptosporidium NOT DETECTED NOT DETECTED Final   Cyclospora cayetanensis NOT DETECTED NOT DETECTED Final   Entamoeba histolytica NOT DETECTED NOT DETECTED Final   Giardia lamblia NOT DETECTED NOT DETECTED Final   Adenovirus F40/41 NOT DETECTED NOT DETECTED Final   Astrovirus NOT DETECTED NOT DETECTED Final   Norovirus GI/GII NOT DETECTED NOT DETECTED Final   Rotavirus A  NOT DETECTED NOT DETECTED Final   Sapovirus (I, II, IV, and V) NOT DETECTED NOT DETECTED Final    Comment: Performed at [facility], [address] [place], [place] [address]    Labs: CBC: Recent Labs  Lab 12/09/23 1349 12/10/23 0613 12/13/23 0409  WBC 10.3 8.1 8.3  NEUTROABS 6.8  --   --   HGB 13.9 14.9 13.8  HCT 43.3 46.3* 42.5  MCV 91.2 91.7 89.9  PLT 324 328 294   Basic Metabolic Panel: Recent Labs  Lab 12/09/23 1349 12/10/23 0613 12/13/23 0409  NA 137 138 138  K 4.0 4.8 4.7  CL 104 102 102  CO2 21* 21* 26  GLUCOSE 128* 148* 155*  BUN 12 15 18   CREATININE 0.87 1.04* 1.07*  CALCIUM  9.1 9.8 9.5   Liver Function Tests: Recent Labs  Lab 12/09/23 1349  AST 20  ALT 15  ALKPHOS 63  BILITOT 0.4  PROT 7.5  ALBUMIN 3.3*   CBG: Recent Labs  Lab 12/12/23 0813 12/12/23 1216  12/12/23 1649 12/12/23 2237 12/13/23 0740  GLUCAP 149* 123* 101* 182* 137*    Discharge time spent: {LESS THAN/GREATER UYJW:73611} 30 minutes.  Signed: Concepcion Riser, MD [facility] 12/13/2023

## 2023-12-14 DIAGNOSIS — G35 Multiple sclerosis: Secondary | ICD-10-CM | POA: Insufficient documentation

## 2023-12-14 NOTE — Discharge Summary (Signed)
 Physician Discharge Summary   Patient: Abigail Vega MRN: [id number] DOB: [birth year]  Admit date:     12/09/2023  Discharge date: 12/13/2023  Discharge Physician: Concepcion Riser   PCP: Okey Carlin Redbird, MD   Recommendations at discharge:    PCP follow up in 1 week. Neurology follow up as scheduled. Ophthalmology follow up suggested.  Discharge Diagnoses: Principal Problem:   Right optic neuritis Active Problems:   Asthma   Diabetes mellitus without complication (HCC)   Obesity, Class III, BMI 40-49.9 (morbid obesity)   Multiple sclerosis (HCC)  Resolved Problems:   * No resolved hospital problems. *  Hospital Course: Abigail Vega is a 48 y.o. female with medical history significant of hypertension, hyperlipidemia, type 2 diabetes mellitus, asthma, seasonal allergies, depression presented from outpatient eye clinic for evaluation of right-sided headaches, visual changes.  MRI of brain, orbits consistent with right-sided optic neuritis, Neurology advised high dose steroids. She is started on 1gm IV solu medrol  for 5 days. Her right eye pain and vision gradually improved.    Assessment and Plan: Right-sided optic neuritis: Possible MS- She did receive  IV Solu-Medrol  1 g every 24 hours for total 5 days.  Right eye visual changes eye pain improved with IV steroid therapy. Neurology follow up advised she will need work up for MS as outpatient, follow up scheduled. Advised to follow up with neurology, eye doctor and PCP as scheduled.   Hypertension: BP stable, resumed losartan .   Type 2 diabetes mellitus: Well controlled A1C 5.2.  Sugars high due to IV steroids, continue Accu-Cheks, sliding scale insulin  per protocol. Resumed metformin, Januvia upon discharge.   Depression: Continue sertraline , Wellbutrin .   Hyperlipidemia: Continue home dose statin.   Obesity class 3 BMI 46. Counseled regarding diet, weight reduction. OSA-CPAP at night.         Consultants: Neurology Procedures performed: none  Disposition: Home Diet recommendation:  Discharge Diet Orders (From admission, onward)     Start     Ordered   12/13/23 0000  Diet - low sodium heart healthy        12/13/23 1002   12/13/23 0000  Diet Carb Modified        12/13/23 1002           Cardiac and Carb modified diet DISCHARGE MEDICATION: Allergies as of 12/13/2023       Reactions   Ozempic (0.25 Or 0.5 Mg-dose) [semaglutide(0.25 Or 0.5mg -dos)] Nausea And Vomiting, Other (See Comments)   Dehydration secondary to vomiting, required hospitalization   Other Cough   Tree, molds, cat, dogs , mites Reports multiple environmental allergies        Medication List     TAKE these medications    ADVIL  DUAL ACTION PO Take 2 tablets by mouth 2 (two) times daily as needed (headache, pain).   albuterol  108 (90 Base) MCG/ACT inhaler Commonly known as: VENTOLIN  HFA Inhale 2 puffs into the lungs every 6 (six) hours as needed for wheezing or shortness of breath.   Auvi-Q 0.3 mg/0.3 mL Soaj injection Generic drug: EPINEPHrine Inject 0.3 mg into the muscle as needed for anaphylaxis.   Blisovi Fe 1.5/30 1.5-30 MG-MCG tablet Generic drug: norethindrone-ethinyl estradiol-iron Take 1 tablet by mouth daily.   buPROPion  150 MG 24 hr tablet Commonly known as: WELLBUTRIN  XL Take 300 mg by mouth daily.   cetirizine 10 MG tablet Commonly known as: ZYRTEC Take 10 mg by mouth daily.   Fish Oil  1000 MG Caps Take 2,000 mg  by mouth daily.   fluticasone  50 MCG/ACT nasal spray Commonly known as: FLONASE  Place 1-2 sprays into both nostrils daily as needed for rhinitis.   losartan  25 MG tablet Commonly known as: COZAAR  Take 1 tablet by mouth daily.   metFORMIN 500 MG tablet Commonly known as: GLUCOPHAGE Take 1,000 mg by mouth daily with breakfast.   montelukast  10 MG tablet Commonly known as: SINGULAIR  Take 10 mg by mouth daily.   rosuvastatin  5 MG  tablet Commonly known as: CRESTOR  Take 5 mg by mouth See admin instructions. Take 1 tablet (5mg ) by mouth in the mornings, twice a week on Sunday and Wednesday.   sertraline  25 MG tablet Commonly known as: ZOLOFT  Take 25 mg by mouth daily.   spironolactone  25 MG tablet Commonly known as: ALDACTONE  Take 25 mg by mouth daily.   SUMAtriptan  100 MG tablet Commonly known as: IMITREX  Take 100 mg by mouth See admin instructions. Take 1 tablet (100mg ) by mouth once daily as needed for migraine, may repeat in 2 hours if headache persists or recurs. Not to exceed 2 tablets (200mg ) in 24 hours.   UNABLE TO FIND Inject 1 Dose into the skin See admin instructions. Allergy injections; 1 injection subcutaneously every week until maintenance dose is reached, then 1 injection every 3 weeks.   VITAMIN C  PO Take 3,000 mg by mouth daily. OTC Liposomal Vitamin C  1500mg  capsules.   Vitamin D3 50 MCG (2000 UT) capsule Take 2,000 Units by mouth daily.        Follow-up Information     Sater, Charlie LABOR, MD. Call.   Specialty: Neurology Why: If you have not heard from the clinic within 1 week Contact information: [address] [place] [address] [phone]                Discharge Exam: [name] Weights   12/12/23 1013  Weight: (!) 138 kg      12/13/2023    8:24 AM 12/13/2023    5:10 AM 12/13/2023    5:09 AM  Vitals with BMI  Systolic 117 123 876  Diastolic 69 70 70  Pulse 68 70 62    General -   Middle aged obese Caucasian obese female, no distress HEENT - PERRLA, EOMI, atraumatic head, non tender sinuses. Lung - distant, no rales, rhonchi, wheezes. Heart - S1, S2 heard, no murmurs, rubs, trace pedal edema. Abdomen - Soft, non tender, obese, bowel sounds good Neuro - Alert, awake and oriented x 3, non focal exam. Skin - Warm and dr  Condition at discharge: stable  The results of significant diagnostics from this hospitalization (including imaging, microbiology,  ancillary and laboratory) are listed below for reference.   Imaging Studies: MR CERVICAL SPINE W WO CONTRAST Result Date: 12/09/2023 CLINICAL DATA:  Demyelinating disease EXAM: MRI CERVICAL AND THORACIC SPINE WITHOUT AND WITH CONTRAST TECHNIQUE: Multiplanar and multiecho pulse sequences of the cervical spine, to include the craniocervical junction and cervicothoracic junction, and the thoracic spine, were obtained without and with intravenous contrast. CONTRAST:  10mL GADAVIST  GADOBUTROL  1 MMOL/ML IV SOLN COMPARISON:  None Available. FINDINGS: MRI CERVICAL SPINE FINDINGS Alignment: No substantial sagittal subluxation. Vertebrae: No fracture, evidence of discitis, or bone lesion. Cord: Normal cord signal.  No abnormal enhancement. Posterior Fossa, vertebral arteries, paraspinal tissues: Negative. Disc levels: Mild disc bulging and facet arthropathy at multiple levels without significant canal or foraminal stenosis. MRI THORACIC SPINE FINDINGS Alignment:  Physiologic. Vertebrae: No fracture, evidence of discitis, or bone lesion. Cord:  Normal cord signal.  No abnormal enhancement. Paraspinal and other soft tissues: Negative. Disc levels: No significant canal or foraminal stenosis. IMPRESSION: 1. Normal cord signal. No evidence of demyelinating disease in the cervical or thoracic spine. 2. No significant canal or foraminal stenosis. Electronically Signed   By: Gilmore GORMAN Molt M.D.   On: 12/09/2023 22:46   MR THORACIC SPINE W WO CONTRAST Result Date: 12/09/2023 CLINICAL DATA:  Demyelinating disease EXAM: MRI CERVICAL AND THORACIC SPINE WITHOUT AND WITH CONTRAST TECHNIQUE: Multiplanar and multiecho pulse sequences of the cervical spine, to include the craniocervical junction and cervicothoracic junction, and the thoracic spine, were obtained without and with intravenous contrast. CONTRAST:  10mL GADAVIST  GADOBUTROL  1 MMOL/ML IV SOLN COMPARISON:  None Available. FINDINGS: MRI CERVICAL SPINE FINDINGS Alignment: No  substantial sagittal subluxation. Vertebrae: No fracture, evidence of discitis, or bone lesion. Cord: Normal cord signal.  No abnormal enhancement. Posterior Fossa, vertebral arteries, paraspinal tissues: Negative. Disc levels: Mild disc bulging and facet arthropathy at multiple levels without significant canal or foraminal stenosis. MRI THORACIC SPINE FINDINGS Alignment:  Physiologic. Vertebrae: No fracture, evidence of discitis, or bone lesion. Cord:  Normal cord signal.  No abnormal enhancement. Paraspinal and other soft tissues: Negative. Disc levels: No significant canal or foraminal stenosis. IMPRESSION: 1. Normal cord signal. No evidence of demyelinating disease in the cervical or thoracic spine. 2. No significant canal or foraminal stenosis. Electronically Signed   By: Gilmore GORMAN Molt M.D.   On: 12/09/2023 22:46   MR Brain W and Wo Contrast Result Date: 12/09/2023 CLINICAL DATA:  Provided history: Vision loss, monocular. Additional history provided: Headache. Concern for optic neuritis on right. History of optic neuritis on left (2019). EXAM: MRI HEAD AND ORBITS WITHOUT AND WITH CONTRAST TECHNIQUE: Multiplanar, multiecho pulse sequences of the brain and surrounding structures were obtained without and with intravenous contrast. Multiplanar, multiecho pulse sequences of the orbits and surrounding structures were obtained including fat saturation techniques, before and after intravenous contrast administration. CONTRAST:  10mL GADAVIST  GADOBUTROL  1 MMOL/ML IV SOLN COMPARISON:  MRI orbits 11/03/2017 (images available, report unavailable). FINDINGS: MRI HEAD FINDINGS Brain: Cerebral volume is normal. No cortical encephalomalacia is identified. No significant cerebral white matter disease. There is no acute infarct. No evidence of an intracranial mass. No extra-axial fluid collection. No midline shift. No pathologic intracranial enhancement identified. Vascular: Maintained flow voids within the proximal  large arterial vessels. 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 Skull and upper cervical spine: No focal worrisome marrow lesion. MRI ORBITS FINDINGS Orbits: Prominent enhancement of the intraorbital right optic nerve and optic nerve sheath. Left optic nerve atrophy. The globes are normal in size and contour. The extraocular muscles and lacrimal glands are symmetric and unremarkable. No pathologic enhancement within the left orbit. Visualized sinuses: Postoperative appearance of the paranasal sinuses. Mucous retention cysts measuring up to 11 mm, and trace background mucosal thickening, within the left maxillary sinus. Soft tissues: The visible maxillofacial and upper neck soft tissues are unremarkable. Other: 10 mm polypoid soft tissue focus within the right nasal passage. IMPRESSION: MRI brain: 1. Unremarkable MRI appearance of the brain. No evidence of an acute intracranial abnormality. 2. 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 MRI orbits: 1. Prominent enhancement of the intraorbital right optic nerve and optic nerve sheath compatible with optic neuritis and perineuritis. 2. Left optic nerve atrophy. 3. Paranasal sinus disease as described. 4. 10 mm polypoid soft  tissue focus within the right nasal passage. Direct visualization recommended. Electronically Signed   By: Rockey Childs D.O.   On: 12/09/2023 17:05   MR ORBITS W WO CONTRAST Result Date: 12/09/2023 CLINICAL DATA:  Provided history: Vision loss, monocular. Additional history provided: Headache. Concern for optic neuritis on right. History of optic neuritis on left (2019). EXAM: MRI HEAD AND ORBITS WITHOUT AND WITH CONTRAST TECHNIQUE: Multiplanar, multiecho pulse sequences of the brain and surrounding structures were obtained without and with intravenous contrast. Multiplanar, multiecho pulse sequences of the orbits and surrounding structures were obtained  including fat saturation techniques, before and after intravenous contrast administration. CONTRAST:  10mL GADAVIST  GADOBUTROL  1 MMOL/ML IV SOLN COMPARISON:  MRI orbits 11/03/2017 (images available, report unavailable). FINDINGS: MRI HEAD FINDINGS Brain: Cerebral volume is normal. No cortical encephalomalacia is identified. No significant cerebral white matter disease. There is no acute infarct. No evidence of an intracranial mass. No extra-axial fluid collection. No midline shift. No pathologic intracranial enhancement identified. Vascular: Maintained flow voids within the proximal large arterial vessels. 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 Skull and upper cervical spine: No focal worrisome marrow lesion. MRI ORBITS FINDINGS Orbits: Prominent enhancement of the intraorbital right optic nerve and optic nerve sheath. Left optic nerve atrophy. The globes are normal in size and contour. The extraocular muscles and lacrimal glands are symmetric and unremarkable. No pathologic enhancement within the left orbit. Visualized sinuses: Postoperative appearance of the paranasal sinuses. Mucous retention cysts measuring up to 11 mm, and trace background mucosal thickening, within the left maxillary sinus. Soft tissues: The visible maxillofacial and upper neck soft tissues are unremarkable. Other: 10 mm polypoid soft tissue focus within the right nasal passage. IMPRESSION: MRI brain: 1. Unremarkable MRI appearance of the brain. No evidence of an acute intracranial abnormality. 2. 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 MRI orbits: 1. Prominent enhancement of the intraorbital right optic nerve and optic nerve sheath compatible with optic neuritis and perineuritis. 2. Left optic nerve atrophy. 3. Paranasal sinus disease as described. 4. 10 mm polypoid soft tissue focus within the right nasal passage. Direct visualization  recommended. Electronically Signed   By: Rockey Childs D.O.   On: 12/09/2023 17:05    Microbiology: Results for orders placed or performed during the hospital encounter of 08/06/22  Blood culture (routine x 2)     Status: None   Collection Time: 08/06/22 10:54 AM   Specimen: BLOOD  Result Value Ref Range Status   Specimen Description   Final    BLOOD RIGHT ANTECUBITAL Performed at [facility], [address] [place], [place] [address]    Special Requests   Final    BOTTLES DRAWN AEROBIC AND ANAEROBIC Blood Culture adequate volume Performed at [facility], [address], [place], [place] [address]    Culture   Final    NO GROWTH 5 DAYS Performed at [facility], [address] [place], [place] [address]    Report Status 08/11/2022 FINAL  Final  Blood culture (routine x 2)     Status: None   Collection Time: 08/06/22 11:10 AM   Specimen: BLOOD LEFT HAND  Result Value Ref Range Status   Specimen Description   Final    BLOOD LEFT HAND Performed at [facility], [address], [place], [place] [address]    Special Requests  Final    BOTTLES DRAWN AEROBIC AND ANAEROBIC Blood Culture results may not be optimal due to an inadequate volume of blood received in culture bottles Performed at [facility], [address] [place], [place] [address]    Culture   Final    NO GROWTH 5 DAYS Performed at [facility], [address], [place], [place] [address]    Report Status 08/11/2022 FINAL  Final  Gastrointestinal Panel by PCR , Stool     Status: None   Collection Time: 08/11/22 10:10 AM  Result Value Ref Range Status   Campylobacter species NOT DETECTED NOT DETECTED Final   Plesimonas shigelloides NOT DETECTED NOT DETECTED Final   Salmonella species NOT DETECTED NOT DETECTED Final   Yersinia enterocolitica NOT DETECTED NOT DETECTED Final   Vibrio species NOT DETECTED NOT DETECTED Final   Vibrio cholerae NOT DETECTED NOT  DETECTED Final   Enteroaggregative E coli (EAEC) NOT DETECTED NOT DETECTED Final   Enteropathogenic E coli (EPEC) NOT DETECTED NOT DETECTED Final   Enterotoxigenic E coli (ETEC) NOT DETECTED NOT DETECTED Final   Shiga like toxin producing E coli (STEC) NOT DETECTED NOT DETECTED Final   Shigella/Enteroinvasive E coli (EIEC) NOT DETECTED NOT DETECTED Final   Cryptosporidium NOT DETECTED NOT DETECTED Final   Cyclospora cayetanensis NOT DETECTED NOT DETECTED Final   Entamoeba histolytica NOT DETECTED NOT DETECTED Final   Giardia lamblia NOT DETECTED NOT DETECTED Final   Adenovirus F40/41 NOT DETECTED NOT DETECTED Final   Astrovirus NOT DETECTED NOT DETECTED Final   Norovirus GI/GII NOT DETECTED NOT DETECTED Final   Rotavirus A NOT DETECTED NOT DETECTED Final   Sapovirus (I, II, IV, and V) NOT DETECTED NOT DETECTED Final    Comment: Performed at [facility], [address] [place], [place] [address]    Labs: CBC: Recent Labs  Lab 12/09/23 1349 12/10/23 0613 12/13/23 0409  WBC 10.3 8.1 8.3  NEUTROABS 6.8  --   --   HGB 13.9 14.9 13.8  HCT 43.3 46.3* 42.5  MCV 91.2 91.7 89.9  PLT 324 328 294   Basic Metabolic Panel: Recent Labs  Lab 12/09/23 1349 12/10/23 0613 12/13/23 0409  NA 137 138 138  K 4.0 4.8 4.7  CL 104 102 102  CO2 21* 21* 26  GLUCOSE 128* 148* 155*  BUN 12 15 18   CREATININE 0.87 1.04* 1.07*  CALCIUM  9.1 9.8 9.5   Liver Function Tests: Recent Labs  Lab 12/09/23 1349  AST 20  ALT 15  ALKPHOS 63  BILITOT 0.4  PROT 7.5  ALBUMIN 3.3*   CBG: Recent Labs  Lab 12/12/23 0813 12/12/23 1216 12/12/23 1649 12/12/23 2237 12/13/23 0740  GLUCAP 149* 123* 101* 182* 137*    Discharge time spent: 34 minutes.  Signed: Concepcion Riser, MD [facility] 12/14/2023

## 2023-12-16 NOTE — Progress Notes (Unsigned)
 [facility]  PATIENT: Abigail Vega DOB: 1976-05-13  REFERRING DOCTOR OR PCP: Carlin Dixons, MD; Lucie Stair, OD SOURCE: Patient, notes from recent hospitalization, primary care and optometry, imaging and lab reports, MRI images personally reviewed  _________________________________   HISTORICAL  CHIEF COMPLAINT:  No chief complaint on file.   HISTORY OF PRESENT ILLNESS:  I had the pleasure of seeing your patient, Abigail Matthies, at the [facility] at [facility] for neurologic consultation regarding her second episode of optic neuritis  Ms. Teresa is a 48 year old woman with non-insulin -dependent diabetes mellitus, hypertension and hyperlipidemia who has had 2 episodes of optic neuritis.   In 2019, she had the onset of left eye pain with horizontal movements and then woke up the next morning with a gray haze over the eye.   She noted that she needed to make text larger to read.   She received IV Solumedrol and improved over a couple weeks.  She noted that that I was her better eye in general and she felt the improvement was back to baseline.  Two weeks ago (mid July 2025), she had the onset of similar eye pain, followed the next day by spots in her vision and the following day with hand waving acuity only at her optometrist office.  She was sent to the ED.  MRI showed optic neuritis and she was admitted and received 5 days of IV Solumedrol.    She had a follow-up eye exam and was improved at 20/200.   She continues to improve and was 20/100 today.   Colors were initially desaturated OD but now are more vivid.    She has never had symptoms consistent with a spinal cord syndrome.    She had anti-NMO tested (negative) but not anti-MOG.   I personally reviewed the MRI imaging.  Of note, optic neuritis is longitudinally extensive with enhancement of the intraorbital and initial canalicular.  Perineuritis is also noted.  Last week, I spoke with Dr.  Lucie Stair on 12/16/2023.  She reports that the patient had presented to her with severe visual loss on the right and optic disc edema.  She had finger counting acuity but could not read.  In the hospital, she received 5 days of IV Solu-Medrol .  On follow-up 12/16/2023, resolution was 20/200.  Imaging: MRI of the orbits 12/09/2023 shows longitudinally extensive enhancement of the right intraorbital and canalicular optic nerve.  Perineuritis is also noted.SABRA  MRI of the brain 12/09/2023 was normal.  MRI of the cervical and thoracic spine 12/09/2023 showed normal spinal cord and no significant degenerative change (minimal C5-C6 and C6-C7)  CT scan of lungs 12/01/2020 showed pneumonia and prominent mediastinal lymph node enlargement.  Labs July 2025: NMO IgG, HIV were negative   REVIEW OF SYSTEMS: Constitutional: No fevers, chills, sweats, or change in appetite Eyes: She denies current eye pain.  Vision is improving on the right and is baseline on the left.  Color vision is now symmetric. Ear, nose and throat: No hearing loss, ear pain, nasal congestion, sore throat Cardiovascular: No chest pain, palpitations Respiratory:  No shortness of breath at rest or with exertion.   No wheezes GastrointestinaI: No nausea, vomiting, diarrhea, abdominal pain, fecal incontinence Genitourinary:  No dysuria, urinary retention or frequency.  No nocturia. Musculoskeletal:  No neck pain, back pain Integumentary: No rash, pruritus, skin lesions Neurological: as above Psychiatric: Some anxiety and depression.  She is on Zoloft  Endocrine: No palpitations, diaphoresis, change in appetite, change  in weigh or increased thirst Hematologic/Lymphatic:  No anemia, purpura, petechiae. Allergic/Immunologic: She has seasonal allergies.  ALLERGIES: Allergies  Allergen Reactions   Ozempic (0.25 Or 0.5 Mg-Dose) [Semaglutide(0.25 Or 0.5mg -Dos)] Nausea And Vomiting and Other (See Comments)    Dehydration secondary to  vomiting, required hospitalization   Other Cough    Tree, molds, cat, dogs , mites Reports multiple environmental allergies    HOME MEDICATIONS:  Current Outpatient Medications:    albuterol  (VENTOLIN  HFA) 108 (90 Base) MCG/ACT inhaler, Inhale 2 puffs into the lungs every 6 (six) hours as needed for wheezing or shortness of breath., Disp: , Rfl:    Ascorbic Acid  (VITAMIN C  PO), Take 3,000 mg by mouth daily. OTC Liposomal Vitamin C  1500mg  capsules., Disp: , Rfl:    buPROPion  (WELLBUTRIN  XL) 150 MG 24 hr tablet, Take 300 mg by mouth daily., Disp: , Rfl:    cetirizine (ZYRTEC) 10 MG tablet, Take 10 mg by mouth daily., Disp: , Rfl:    Cholecalciferol (VITAMIN D3) 50 MCG (2000 UT) capsule, Take 2,000 Units by mouth daily., Disp: , Rfl:    EPINEPHrine (AUVI-Q) 0.3 mg/0.3 mL IJ SOAJ injection, Inject 0.3 mg into the muscle as needed for anaphylaxis., Disp: , Rfl:    fluticasone  (FLONASE ) 50 MCG/ACT nasal spray, Place 1-2 sprays into both nostrils daily as needed for rhinitis., Disp: , Rfl:    Ibuprofen -Acetaminophen  (ADVIL  DUAL ACTION PO), Take 2 tablets by mouth 2 (two) times daily as needed (headache, pain)., Disp: , Rfl:    losartan  (COZAAR ) 25 MG tablet, Take 1 tablet by mouth daily., Disp: , Rfl:    metFORMIN (GLUCOPHAGE) 500 MG tablet, Take 1,000 mg by mouth daily with breakfast., Disp: , Rfl:    montelukast  (SINGULAIR ) 10 MG tablet, Take 10 mg by mouth daily., Disp: , Rfl:    norethindrone-ethinyl estradiol-iron (BLISOVI FE 1.5/30) 1.5-30 MG-MCG tablet, Take 1 tablet by mouth daily., Disp: , Rfl:    Omega-3 Fatty Acids (FISH OIL ) 1000 MG CAPS, Take 2,000 mg by mouth daily., Disp: , Rfl:    rosuvastatin  (CRESTOR ) 5 MG tablet, Take 5 mg by mouth See admin instructions. Take 1 tablet (5mg ) by mouth in the mornings, twice a week on Sunday and Wednesday., Disp: , Rfl:    sertraline  (ZOLOFT ) 25 MG tablet, Take 25 mg by mouth daily., Disp: , Rfl:    spironolactone  (ALDACTONE ) 25 MG tablet, Take 25  mg by mouth daily., Disp: , Rfl:    SUMAtriptan  (IMITREX ) 100 MG tablet, Take 100 mg by mouth See admin instructions. Take 1 tablet (100mg ) by mouth once daily as needed for migraine, may repeat in 2 hours if headache persists or recurs. Not to exceed 2 tablets (200mg ) in 24 hours., Disp: , Rfl:    UNABLE TO FIND, Inject 1 Dose into the skin See admin instructions. Allergy injections; 1 injection subcutaneously every week until maintenance dose is reached, then 1 injection every 3 weeks., Disp: , Rfl:   PAST MEDICAL HISTORY: Past Medical History:  Diagnosis Date   Asthma    Depression    Hyperlipidemia    Hypertension    Seasonal allergies     PAST SURGICAL HISTORY: Past Surgical History:  Procedure Laterality Date   COLONOSCOPY WITH PROPOFOL  N/A 12/01/2020   Procedure: COLONOSCOPY WITH PROPOFOL ;  Surgeon: Rosalie Kitchens, MD;  Location: [facility];  Service: Endoscopy;  Laterality: N/A;   NASAL SEPTOPLASTY W/ TURBINOPLASTY  1991   RIGHT/LEFT HEART CATH AND CORONARY ANGIOGRAPHY N/A 01/02/2021  Procedure: RIGHT/LEFT HEART CATH AND CORONARY ANGIOGRAPHY;  Surgeon: Ladona Heinz, MD;  Location: [facility] INVASIVE [facility];  Service: Cardiovascular;  Laterality: N/A;   TONSILECTOMY, ADENOIDECTOMY, BILATERAL MYRINGOTOMY AND TUBES  1981   TYMPANOPLASTY  1986    FAMILY HISTORY: No family history on file.  SOCIAL HISTORY: Social History   Socioeconomic History   Marital status: Single    Spouse name: Not on file   Number of children: Not on file   Years of education: Not on file   Highest education level: Not on file  Occupational History   Not on file  Tobacco Use   Smoking status: Never   Smokeless tobacco: Never  Vaping Use   Vaping status: Never Used  Substance and Sexual Activity   Alcohol use: No   Drug use: No   Sexual activity: Not on file  Other Topics Concern   Not on file  Social History Narrative   Not on file   Social Drivers of Health   Financial Resource Strain: Not on  file  Food Insecurity: No Food Insecurity (12/11/2023)   Hunger Vital Sign    Worried About Running Out of Food in the Last Year: Never true    Ran Out of Food in the Last Year: Never true  Transportation Needs: No Transportation Needs (12/11/2023)   PRAPARE - Administrator, Civil Service (Medical): No    Lack of Transportation (Non-Medical): No  Physical Activity: Not on file  Stress: Not on file  Social Connections: Not on file  Intimate Partner Violence: Not At Risk (12/11/2023)   Humiliation, Afraid, Rape, and Kick questionnaire    Fear of Current or Ex-Partner: No    Emotionally Abused: No    Physically Abused: No    Sexually Abused: No       PHYSICAL EXAM  There were no vitals filed for this visit.  There is no height or weight on file to calculate BMI.   Vision Screening   Right eye Left eye Both eyes  Without correction 20/100 20/30 20/200  With correction       General: The patient is well-developed and well-nourished and in no acute distress  HEENT:  Head is [place]/AT.  Sclera are anicteric.  Funduscopic exam shows optic disc edema on the right.  The left optic disc was normal.  Neck: No carotid bruits are noted.  The neck is nontender.  Cardiovascular: The heart has a regular rate and rhythm with a normal S1 and S2. There were no murmurs, gallops or rubs.    Skin: Extremities are without rash or  edema.  Musculoskeletal:  Back is nontender  Neurologic Exam  Mental status: The patient is alert and oriented x 3 at the time of the examination. The patient has apparent normal recent and remote memory, with an apparently normal attention span and concentration ability.   Speech is normal.  Cranial nerves: Extraocular movements are full. Pupils show a mild right APD.  Color vision was symmetric.  Visual fields are full.  Facial symmetry is present. There is good facial sensation to soft touch bilaterally.Facial strength is normal.  Trapezius and  sternocleidomastoid strength is normal. No dysarthria is noted.  The tongue is midline, and the patient has symmetric elevation of the soft palate. No obvious hearing deficits are noted.  Motor:  Muscle bulk is normal.   Tone is normal. Strength is  5 / 5 in all 4 extremities.   Sensory: Sensory testing is intact  to pinprick, soft touch and vibration sensation in arms but reduced touch, pinprick and vibration in toes (normal to near normal at ankles)    Coordination: Cerebellar testing reveals good finger-nose-finger and heel-to-shin bilaterally.  Gait and station: Station is normal.   Gait is normal. Tandem gait is normal. Romberg is negative.   Reflexes: Deep tendon reflexes are symmetric and normal bilaterally.   Plantar responses are flexor.    DIAGNOSTIC DATA (LABS, IMAGING, TESTING) - I reviewed patient records, labs, notes, testing and imaging myself where available.  Lab Results  Component Value Date   WBC 8.3 12/13/2023   HGB 13.8 12/13/2023   HCT 42.5 12/13/2023   MCV 89.9 12/13/2023   PLT 294 12/13/2023      Component Value Date/Time   NA 138 12/13/2023 0409   NA 142 02/22/2021 1438   K 4.7 12/13/2023 0409   CL 102 12/13/2023 0409   CO2 26 12/13/2023 0409   GLUCOSE 155 (H) 12/13/2023 0409   BUN 18 12/13/2023 0409   BUN 12 02/22/2021 1438   CREATININE 1.07 (H) 12/13/2023 0409   CALCIUM  9.5 12/13/2023 0409   PROT 7.5 12/09/2023 1349   ALBUMIN 3.3 (L) 12/09/2023 1349   AST 20 12/09/2023 1349   ALT 15 12/09/2023 1349   ALKPHOS 63 12/09/2023 1349   BILITOT 0.4 12/09/2023 1349   GFRNONAA >60 12/13/2023 0409   No results found for: CHOL, HDL, LDLCALC, LDLDIRECT, TRIG, CHOLHDL Lab Results  Component Value Date   HGBA1C 5.2 12/09/2023   Lab Results  Component Value Date   VITAMINB12 333 12/13/2023   Lab Results  Component Value Date   TSH 0.735 12/13/2023       ASSESSMENT AND PLAN  Demyelinating disorder (HCC) - Plan: Anti-MOG, Serum, ANCA  Profile, Angiotensin converting enzyme, QuantiFERON-TB Gold Plus, Hep B Surface Antigen, Hepatitis C antibody, Hepatitis B Core AB, Total, Varicella zoster antibody, IgG  Right optic neuritis  Diabetic polyneuropathy associated with type 2 diabetes mellitus (HCC)  High risk medication use - Plan: Anti-MOG, Serum, ANCA Profile, Angiotensin converting enzyme, QuantiFERON-TB Gold Plus, Hep B Surface Antigen, Hepatitis C antibody, Hepatitis B Core AB, Total, Varicella zoster antibody, IgG  Vitamin D  deficiency - Plan: VITAMIN D  25 Hydroxy (Vit-D Deficiency, Fractures)   In summary, Ms. Teresa is a 48 year old woman currently recovering from an episode of right optic neuritis who had a similar episode of left optic neuritis in 2019.  The MRI shows that the optic neuritis is longitudinally extensive and extends from just behind the globe to the proximal canalicular segment.  There is neuritis as well as perineuritis.  I was able to pull up the MRI from 2019 and that episode of optic neuritis was similarly longitudinally extensive with perineuritis and neuritis.  MRI of the brain was normal both times.  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  If all of the blood work is normal we will need to consider checking a lumbar puncture to determine if there are oligoclonal bands which could be seen in MS, though it would be unusual not to have any brain lesions over a 6-year period of time.  If she does have antibodies against MOG, then I  would consider treatment with either Actemra or Rituxan to prevent additional relapses.  Fortunately, she has not had a spinal cord syndrome.  Thank you for asking me to see this  patient.  Please let me know if I be of further assistance with her or other patients in the future.  I have a general follow-up scheduled for her in 6 months but will see her sooner based on the results of the studies or if she has significant new or worsening neurologic symptoms.     Krishawna Stiefel A. Vear, MD, [facility] 12/16/2023, 12:45 PM Certified in Neurology, Clinical Neurophysiology, Sleep Medicine and Neuroimaging  [facility] [address] [place], [place] [address] [phone]

## 2023-12-17 ENCOUNTER — Ambulatory Visit: Payer: Self-pay | Admitting: Neurology

## 2023-12-22 ENCOUNTER — Encounter: Payer: Self-pay | Admitting: Neurology

## 2023-12-22 ENCOUNTER — Ambulatory Visit: Admitting: Neurology

## 2023-12-22 VITALS — BP 128/69 | HR 89 | Ht 68.0 in | Wt 344.5 lb

## 2023-12-22 DIAGNOSIS — Z7984 Long term (current) use of oral hypoglycemic drugs: Secondary | ICD-10-CM

## 2023-12-22 DIAGNOSIS — G379 Demyelinating disease of central nervous system, unspecified: Secondary | ICD-10-CM | POA: Diagnosis not present

## 2023-12-22 DIAGNOSIS — E1142 Type 2 diabetes mellitus with diabetic polyneuropathy: Secondary | ICD-10-CM

## 2023-12-22 DIAGNOSIS — E559 Vitamin D deficiency, unspecified: Secondary | ICD-10-CM

## 2023-12-22 DIAGNOSIS — H469 Unspecified optic neuritis: Secondary | ICD-10-CM

## 2023-12-22 DIAGNOSIS — Z79899 Other long term (current) drug therapy: Secondary | ICD-10-CM | POA: Diagnosis not present

## 2023-12-26 ENCOUNTER — Telehealth: Payer: Self-pay | Admitting: Neurology

## 2023-12-26 LAB — ANCA PROFILE
Anti-MPO Antibodies: <0.2 U (ref 0.0–0.9)
Anti-PR3 Antibodies: <0.2 U (ref 0.0–0.9)
Atypical pANCA: <1:20 {titer}
C-ANCA: <1:20 {titer}
P-ANCA: <1:20 {titer}

## 2023-12-26 LAB — ANGIOTENSIN CONVERTING ENZYME: Angio Convert Enzyme: 20 U/L (ref 14–82)

## 2023-12-26 LAB — HEPATITIS B CORE ANTIBODY, TOTAL: Hep B Core Total Ab: NEGATIVE

## 2023-12-26 LAB — HEPATITIS B SURFACE ANTIGEN: Hepatitis B Surface Ag: NEGATIVE

## 2023-12-26 LAB — VITAMIN D 25 HYDROXY (VIT D DEFICIENCY, FRACTURES): Vit D, 25-Hydroxy: 38.7 ng/mL (ref 30.0–100.0)

## 2023-12-26 LAB — QUANTIFERON-TB GOLD PLUS
QuantiFERON Mitogen Value: >10 [IU]/mL
QuantiFERON Nil Value: 0.09 [IU]/mL
QuantiFERON TB1 Ag Value: 0.17 [IU]/mL
QuantiFERON TB2 Ag Value: 0.24 [IU]/mL
QuantiFERON-TB Gold Plus: NEGATIVE

## 2023-12-26 LAB — ANTI-MOG, SERUM: MOG Antibody, Cell-based IFA: POSITIVE — AB

## 2023-12-26 LAB — ANTI-MOG ANTIBODY TITER, SERUM: Anti-MOG Antibody Titer, Serum: 1:32 {titer}

## 2023-12-26 LAB — VARICELLA ZOSTER ANTIBODY, IGG: Varicella zoster IgG: REACTIVE

## 2023-12-26 LAB — HEPATITIS C ANTIBODY: Hep C Virus Ab: NONREACTIVE

## 2023-12-26 NOTE — Telephone Encounter (Signed)
 Start form filled out, pending MD signature

## 2023-12-26 NOTE — Telephone Encounter (Signed)
 Dr. Vear- Do you want the IV or SQ option for Actemra?

## 2023-12-26 NOTE — Telephone Encounter (Signed)
 I called the patient to go over the recent results.  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  Please check to see if we need the patient to do signature.  All of her labs were fine   Telephone call with Ms. Mccollum 12/26/2023 9 AM: I discussed the lab results with Ms. Murdy.  She had 2 episodes of optic neuritis, 1 in 2019 and 1 in 2025.  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  We checked blood work and the anti-MOG antibody is present with a titer of 1: 32.  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  I discussed treatment options with her.  The best evidence for relapsing MOGAD is with tocilizumab (Actemra) and rituximab..  I discussed the options.  By my review, the data looks a little bit better for tocilizumab so that would be my choice to get her started.  There is no FDA approved therapy though a couple medications are in clinical trials.    We will see if we can get coverage with insurance for the tocilizumab.  If not, rituximab would be an option with similar benefit.  At her last visit I checked blood work for chronic infections and she is fine.  Recent lipid panel was fine.SABRA

## 2023-12-29 ENCOUNTER — Telehealth: Payer: Self-pay | Admitting: Neurology

## 2023-12-29 NOTE — Telephone Encounter (Signed)
 I spoke with Ms. Abigail Vega about the risks and benefits of Actemra.    She has been concerned about several of the risks including infection, GI perforation, cholesterol.    There is a small risk of GI perforation (+ / - 0.3%).  However, from a paper I reviewed the risks were higher in people who were older as well as people who have a history of diverticulitis and people on steroids.  Therefore her risk would be lower than the 0.3% (though exact risk is uncertain).   The risk of infection is present with any of the medications we use for autoimmune diseases.  Her Quantiferon TB test is negative.  In general, the risk of infection would be expected to be lower with Actemra then with Rituxan.  If the cholesterol is increased (I will check about 3 months after she starts), then we could adjust her Crestor  dose.  She wishes to proceed with the Actemra

## 2023-12-29 NOTE — Telephone Encounter (Signed)
 Dr. Vear- can you follow up with pt about concerns?

## 2023-12-29 NOTE — Telephone Encounter (Addendum)
 Faxed completed/signed start form to [employer] at [phone]. Received fax confirmation.  Initiated PA on covermymeds. Key: [id number]. Waiting on questions to be available from [facility] [employer] to proceed.

## 2023-12-30 NOTE — Telephone Encounter (Signed)
 Spoke w/ Dr. Vear. Will work on getting medication approved through insurance

## 2023-12-30 NOTE — Telephone Encounter (Signed)
 Speaking with Dr. Vear to see if he wants to appeal.

## 2023-12-30 NOTE — Telephone Encounter (Signed)
 SABRA

## 2023-12-30 NOTE — Telephone Encounter (Signed)
 Initiated PA on covermymeds. Key: [id number]. Waiting on questions to be available from [employer] to proceed.

## 2023-12-30 NOTE — Telephone Encounter (Signed)
Submitted PA. Waiting on determination from [employer]. 

## 2024-01-05 NOTE — Telephone Encounter (Signed)
 Called [phone] to check on status of appeal. Spoke w/ Tammy. She asked that I call appeal team at Phone: [phone]. Fax: [phone]. I called and spoke w/ Tobias. Case still under review. Case due date 01/14/24.  Case# [id number].  I asked her to make sure case marked urgent. She reviewed and confirmed it is expedited appeal which should take 4-5 business days for determination. I questioned why due date 01/14/24 since they received appeal 12/30/23. She was unsure.  She transferred me to expedited appeal team but this was to submit a new request and it instructed me to call member service number to check status of appeal. I will check back later on status.

## 2024-01-14 NOTE — Telephone Encounter (Signed)
 Called appeal team at [phone] and spoke with Wae. Case# [id number]. Appeal denied, states not medically necessary. Asked her to fax denial to [phone]. Ref# for call: [id number].   Will fax completed prescriber foundation form with PA/appeal denial once we receive appeal denial letter via fax.

## 2024-01-15 NOTE — Telephone Encounter (Signed)
 We have not received appeal denial letter yet. Called appeal team at [phone] and spoke with [facility]. Case# [id number]. States only pt can get copy, no MD office. He placed on hold to confirm. He confirmed only pt can get a copy and it is being mailed. I expressed concern that this is urgent and need copy ASAP. He requested copy be re-faxed to [phone]. Should receive within 24-48hr.  He also states specialty pharmacy department. being sent a copy of denial letter.

## 2024-01-28 NOTE — Telephone Encounter (Signed)
 Faxed PA/appeal denial letter along with [employer] form below to [employer] for pt to be screened for free drug. Fax: [phone]. Received fax confirmation.

## 2024-02-09 NOTE — Telephone Encounter (Signed)
 Called [place] [facility] at [phone] and spoke w/ Steffan. Transferred to pharmacist/Luke. Clarified rx should be for Actemra 162mg  Actpen (autoinjector) once every 2 wk. 3 month supply, 3 refills.  They will get this ready for pt. Nothing further needed.

## 2024-03-02 ENCOUNTER — Other Ambulatory Visit: Payer: Self-pay | Admitting: Family Medicine

## 2024-03-02 DIAGNOSIS — Z1231 Encounter for screening mammogram for malignant neoplasm of breast: Secondary | ICD-10-CM

## 2024-03-12 ENCOUNTER — Ambulatory Visit
Admission: RE | Admit: 2024-03-12 | Discharge: 2024-03-12 | Disposition: A | Source: Ambulatory Visit | Attending: Family Medicine | Admitting: Family Medicine

## 2024-03-12 DIAGNOSIS — Z1231 Encounter for screening mammogram for malignant neoplasm of breast: Secondary | ICD-10-CM

## 2024-03-14 ENCOUNTER — Other Ambulatory Visit: Payer: Self-pay | Admitting: Medical Genetics

## 2024-05-18 ENCOUNTER — Encounter (INDEPENDENT_AMBULATORY_CARE_PROVIDER_SITE_OTHER): Payer: Self-pay

## 2024-05-18 ENCOUNTER — Encounter: Payer: Self-pay | Admitting: Neurology

## 2024-06-06 ENCOUNTER — Encounter: Payer: Self-pay | Admitting: Neurology

## 2024-06-14 ENCOUNTER — Other Ambulatory Visit (HOSPITAL_COMMUNITY)

## 2024-07-29 ENCOUNTER — Ambulatory Visit: Admitting: Neurology
